# Patient Record
Sex: Female | Born: 1935
Health system: Southern US, Community
[De-identification: ages and names within clinical notes are randomized; demographics above are authoritative.]

## PROBLEM LIST (undated history)

## (undated) DIAGNOSIS — K589 Irritable bowel syndrome without diarrhea: Secondary | ICD-10-CM

## (undated) DIAGNOSIS — K5790 Diverticulosis of intestine, part unspecified, without perforation or abscess without bleeding: Secondary | ICD-10-CM

## (undated) DIAGNOSIS — K222 Esophageal obstruction: Secondary | ICD-10-CM

## (undated) DIAGNOSIS — K635 Polyp of colon: Secondary | ICD-10-CM

## (undated) DIAGNOSIS — K219 Gastro-esophageal reflux disease without esophagitis: Secondary | ICD-10-CM

## (undated) DIAGNOSIS — I341 Nonrheumatic mitral (valve) prolapse: Secondary | ICD-10-CM

## (undated) DIAGNOSIS — K449 Diaphragmatic hernia without obstruction or gangrene: Secondary | ICD-10-CM

## (undated) HISTORY — PX: FOOT SURGERY: SHX648

## (undated) HISTORY — DX: Diaphragmatic hernia without obstruction or gangrene: K44.9

## (undated) HISTORY — DX: Diverticulosis of intestine, part unspecified, without perforation or abscess without bleeding: K57.90

## (undated) HISTORY — PX: CATARACT EXTRACTION: SUR2

## (undated) HISTORY — DX: Polyp of colon: K63.5

## (undated) HISTORY — PX: ABDOMINAL HYSTERECTOMY: SHX81

## (undated) HISTORY — DX: Gastro-esophageal reflux disease without esophagitis: K21.9

## (undated) HISTORY — PX: EXCISION MORTON'S NEUROMA: SHX5013

## (undated) HISTORY — DX: Irritable bowel syndrome, unspecified: K58.9

## (undated) HISTORY — PX: SINUS SURGERY WITH INSTATRAK: SHX5215

## (undated) HISTORY — DX: Nonrheumatic mitral (valve) prolapse: I34.1

## (undated) HISTORY — DX: Esophageal obstruction: K22.2

## (undated) HISTORY — PX: CARDIAC CATHETERIZATION: SHX172

---

## 2002-12-13 HISTORY — PX: ESOPHAGOGASTRODUODENOSCOPY: SHX1529

## 2002-12-30 ENCOUNTER — Ambulatory Visit (HOSPITAL_COMMUNITY): Admission: RE | Admit: 2002-12-30 | Discharge: 2002-12-30 | Payer: Self-pay | Admitting: Internal Medicine

## 2004-01-27 ENCOUNTER — Ambulatory Visit (HOSPITAL_COMMUNITY): Admission: RE | Admit: 2004-01-27 | Discharge: 2004-01-27 | Payer: Self-pay | Admitting: Internal Medicine

## 2005-03-19 ENCOUNTER — Ambulatory Visit: Payer: Self-pay | Admitting: Internal Medicine

## 2005-03-29 ENCOUNTER — Encounter (HOSPITAL_COMMUNITY): Admission: RE | Admit: 2005-03-29 | Discharge: 2005-04-28 | Payer: Self-pay | Admitting: Internal Medicine

## 2005-06-18 ENCOUNTER — Ambulatory Visit: Payer: Self-pay | Admitting: Internal Medicine

## 2006-03-07 ENCOUNTER — Ambulatory Visit: Payer: Self-pay | Admitting: Internal Medicine

## 2006-07-29 ENCOUNTER — Ambulatory Visit: Payer: Self-pay | Admitting: Internal Medicine

## 2006-09-12 ENCOUNTER — Ambulatory Visit: Payer: Self-pay | Admitting: Internal Medicine

## 2006-09-12 ENCOUNTER — Encounter (INDEPENDENT_AMBULATORY_CARE_PROVIDER_SITE_OTHER): Payer: Self-pay | Admitting: Specialist

## 2006-09-12 ENCOUNTER — Ambulatory Visit (HOSPITAL_COMMUNITY): Admission: RE | Admit: 2006-09-12 | Discharge: 2006-09-12 | Payer: Self-pay | Admitting: Internal Medicine

## 2006-09-12 HISTORY — PX: COLONOSCOPY: SHX174

## 2006-11-13 ENCOUNTER — Ambulatory Visit: Payer: Self-pay | Admitting: Internal Medicine

## 2007-01-06 ENCOUNTER — Encounter: Admission: RE | Admit: 2007-01-06 | Discharge: 2007-01-06 | Payer: Self-pay | Admitting: *Deleted

## 2007-07-10 ENCOUNTER — Encounter: Admission: RE | Admit: 2007-07-10 | Discharge: 2007-07-10 | Payer: Self-pay | Admitting: *Deleted

## 2007-10-14 ENCOUNTER — Ambulatory Visit (HOSPITAL_COMMUNITY): Admission: RE | Admit: 2007-10-14 | Discharge: 2007-10-14 | Payer: Self-pay | Admitting: Pulmonary Disease

## 2007-11-24 ENCOUNTER — Ambulatory Visit: Payer: Self-pay | Admitting: Internal Medicine

## 2007-11-27 ENCOUNTER — Ambulatory Visit: Payer: Self-pay | Admitting: Internal Medicine

## 2008-06-09 ENCOUNTER — Encounter: Admission: RE | Admit: 2008-06-09 | Discharge: 2008-06-09 | Payer: Self-pay | Admitting: Internal Medicine

## 2008-12-08 ENCOUNTER — Ambulatory Visit: Payer: Self-pay | Admitting: Internal Medicine

## 2009-06-13 ENCOUNTER — Encounter: Admission: RE | Admit: 2009-06-13 | Discharge: 2009-06-13 | Payer: Self-pay | Admitting: Internal Medicine

## 2009-11-15 ENCOUNTER — Encounter (INDEPENDENT_AMBULATORY_CARE_PROVIDER_SITE_OTHER): Payer: Self-pay | Admitting: *Deleted

## 2010-06-14 ENCOUNTER — Encounter: Admission: RE | Admit: 2010-06-14 | Discharge: 2010-06-14 | Payer: Self-pay | Admitting: Internal Medicine

## 2010-12-03 ENCOUNTER — Encounter: Payer: Self-pay | Admitting: Internal Medicine

## 2010-12-12 NOTE — Letter (Signed)
Summary: Appointment Reminder  [facility]  [address]   [place], [place] [address]   Phone: [phone]  Fax: [phone]       November 15, 2009   [facility] [place] [address] [place], [place]  [address] 08-01-1931    Dear Ms. Gabrielson,  We have been unable to reach you by phone to schedule a follow up   appointment that was recommended for you by Dr. Jena Gauss. It is very   important that we reach you to schedule an appointment. We hope that you  allow [place] to participate in your health care needs. Please contact us at  [phone] at your earliest convenience to schedule your appointment.  Sincerely,    [facility] R. Roetta Sessions, M.D.    Kassie Mends, M.D. Lorenza Burton, FNP-BC    Tana Coast, PA-C Phone: [phone]    Fax: [phone]

## 2011-03-27 NOTE — Assessment & Plan Note (Signed)
Amy [name], Amy [name]                 CHART#:  [id number]   DATE:  12/08/2008                       DOB:  1931/08/13   FOLLOWUP:  IBS-D.   The patient has done very well, taking Librax sparingly a.c. and at  bedtime.  Several days may go by where she does not need to take  anything and then she has cramping and diarrhea.  Previously, stool  studies came back negative except for positive lactoferrin.  Biopsies of  her colon negative for microscopic colitis.  She is due for routine  screening colonoscopy in 2017.  Overall, doing well.  She is down 2  pounds, currently not having any GI symptoms whatsoever.  She requested  a refill on her clidinium/Librax.   CURRENT MEDICATIONS:  See updated list.   ALLERGIES:  Aspirin and sulfa.   PHYSICAL EXAMINATION:  GENERAL:  Today, looks well.  VITAL SIGNS:  Weight 180, height 5 feet 5-1/2 inches, temp 98.1, BP  130/82, and pulse 92.  SKIN:  Warm and dry.  CHEST:  Lungs are clear to auscultation.  CARDIAC:  Regular rate and rhythm without murmur, gallop, or rub.   ASSESSMENT:  Irritable bowel syndrome-D, doing well.  Not mentioned  above, she tells me, a good 25 out of any given 30 days period of time,  her symptoms were quiescent, which is really overall very good and I  feel her symptoms are relatively well controlled.   RECOMMENDATIONS:  Continue Librax a.c. and at bedtime p.r.n.  I have  given her a 1-year prescription refill.  Unless something comes up, I  plan to see her back in the office in 1 year.       Jonathon Bellows, M.D.  Electronically Signed     RMR/MEDQ  D:  12/08/2008  T:  12/08/2008  Job:  [id number]   cc:   Catalina Pizza, M.D.

## 2011-03-27 NOTE — Assessment & Plan Note (Signed)
Amy [name], Amy [name]                 CHART#:  [id number]   DATE:  11/24/2007                       DOB:  1931-03-07   CHIEF COMPLAINT:  Annual followup GERD and IBS- D.   SUBJECTIVE:  The patient is a 77 year old female with longstanding  history of IBS, which is diarrhea-predominant.  She takes a rare  clindium.  She has been doing very well over the last year.  She  occasionally has episodes where her bowels act up.  She is taking  Librax on occasion, maybe once or twice per month.  She has tried fiber  previously.  It did seem to cause some bloating, therefore she stopped  it.  She has been doing very well as far as her reflux is concerned.  Rarely when she eats a significant amount of chocolate or spicy foods,  she may have heartburn or indigestion.  She stopped omeprazole 20 mg  daily last year and has been taking an occasional Tums or Prilosec over-  the-counter on an as needed basis.  Her weight has remained stable.  She  denies any nausea, vomiting.  Denies any anorexia.  She does take an  aspirin 81 mg daily.  She has noticed some dark stools, but denies any  melena or rectal bleeding.   CURRENT MEDICATIONS:  See the list from 11/24/2007.   ALLERGIES:  Aspirin and sulfa.   OBJECTIVE:  VITAL SIGNS:  Weight:  182 pounds.  Height:  65-1/2 inches.  Temperature:  98 degrees.  Blood pressure:  120/78.  Pulse:  76.  GENERAL:  Amy [name] is a well-developed, well-nourished female in no  acute distress.  HEENT:  Sclerae clear. Conjunctivae pink. Oropharynx pink and moist  without any lesions.  CHEST:  Heart regular rate and rhythm.  Normal S1 and S2.  ABDOMEN:  Positive bowel sounds x4.  No bruits auscultated.  Soft,  nontender, nondistended without hepatosplenomegaly.  No rebound, tissue  guarding  EXTREMITIES:  Without clubbing or edema bilaterally.   ASSESSMENT:  1. The patient is a 77 year old female with IBS-D well controlled at      this time.  Chronic GERD with rare  symptoms:  I feel she may      benefit from intermittent H2 blocker rather than intermittent PPI      therapy.  2. Dark stools are concerning.  We should rule out bleeding/melena.   PLAN:  1. We will obtain recent labs from Dr. Scharlene Gloss office.  2. Colonoscopy 2017 or sooner if needed.  3. I have given her Benefiber samples and she can use fiber supplement      of choice.  4. Over-the-counter Zantac or Pepcid AC for intermittent GERD      symptoms.  5. Hemoccult stools x3.  6. Office visit in one year or sooner if needed.       Lorenza Burton, N.P.  Electronically Signed     R. Roetta Sessions, M.D.  Electronically Signed    KJ/MEDQ  D:  11/24/2007  T:  11/24/2007  Job:  [id number]   cc:   Catalina Pizza, M.D.

## 2011-03-30 NOTE — Op Note (Signed)
Amy [name], DARA                ACCOUNT NO.:  [id number]   MEDICAL RECORD NO.:  [id number]          PATIENT TYPE:  AMB   LOCATION:  DAY                           FACILITY:  [facility]   PHYSICIAN:  R. Roetta Sessions, M.D. DATE OF BIRTH:  [birth year]   DATE OF PROCEDURE:  09/12/2006  DATE OF DISCHARGE:                                 OPERATIVE REPORT   PROCEDURE:  Colonoscopy with biopsy, stool sampling, ileoscopy.   INDICATIONS FOR PROCEDURE:  The patient is a 77 year old lady with chronic  abdominal bloating with diarrhea which is intermittent in nature punctuated  with periods of constipation.  Colonoscopy is now being done for screening  and to further evaluate her diarrhea.  This approach has been discussed with  the patient at length.  Potential risks, benefits and alternatives have been  reviewed, questions answered.  She gives a history of wheat sensitivity.  We  did draw celiac panel, those results were not known to me at this time.   PROCEDURE NOTE:  O2 saturation, blood pressure, pulse and respirations were  monitor throughout the entire procedure.   CONSCIOUS SEDATION:  Versed 4 mg IV, Demerol 75 mg IV in divided doses.   INSTRUMENT:  Olympus video chip system.   FINDINGS:  Digital rectal exam revealed no abnormalities.   ENDOSCOPIC FINDINGS:  The prep was adequate.   Rectum:  Examination of rectal mucosa revealed a relatively small rectal  vault, I was unable to retroflex. Mucosa was well seen for this same reason  and appeared normal.  Colon:  Colonic mucosa was surveyed from rectosigmoid junction through the  left transverse and right colon, appendiceal orifice, ileocecal valve and  cecum.  These structures were well seen and photographed for the record.  Terminal ileum was intubated to 10 cm.  From this level the scope was slowly  withdrawn and all previously mentioned mucosal surfaces were again seen.  The patient had extensive sigmoid diverticula in the left colon  somewhat  noncompliant and there was a been of a challenge getting through it, but  this was ultimately done.  The mucosa was well seen.  She had numerous left-  sided diverticula.  However, the colon mucosa otherwise appeared normal.  Normal terminal ileum.  Biopsies of the sigmoid colon ruled out microscopic  colitis, also stool samples taken.  The patient tolerated the procedure well  as reactive to endoscopy.   IMPRESSION:  1. Normal rectum.  2. Extensive left-sided diverticula, rare colonic mucosa in terminal      ileum.  Mucosa appeared normal.  Segmental biopsy taken, stool sample      collected.   RECOMMENDATIONS:  Follow up on path and stool studies.  Further  recommendations to follow.      Jonathon Bellows, M.D.  Electronically Signed     RMR/MEDQ  D:  09/12/2006  T:  09/12/2006  Job:  [id number]   cc:   [facility]

## 2011-03-30 NOTE — H&P (Signed)
NAMEALEXANDRIA, Amy [name]                ACCOUNT NO.:  [id number]   MEDICAL RECORD NO.:  [id number]          PATIENT TYPE:  AMB   LOCATION:  DAY                           FACILITY:  [facility]   PHYSICIAN:  R. Roetta Sessions, M.D. DATE OF BIRTH:  06-Nov-1931   DATE OF ADMISSION:  DATE OF DISCHARGE:  LH                                HISTORY & PHYSICAL   CHIEF COMPLAINT:  Intermittent bloating, diarrhea and gastroesophageal  reflux disease.  Ms. Amy [name] __________  is a pleasant 77 year old Caucasian  female followed primarily by Dr. Wende Crease in [place], [place], who  has not been seen here in a good year.  She has a history of diarrhea  predominant irritable bowel syndrome with wide swings from diarrhea to  constipation who has had some abdominal bloating recently, reflux symptoms  intermittent two to three episodes a week for which she takes over-the-  counter Prilosec or generic omeprazole.  She is not having any dysphagia.  Has not has any melena or rectal bleeding although she did call him back in  April, thought her stools were a little bit darker.  CBC came back okay and  three Hemoccults came back negative.  She had a colonoscopy back in 2000  which revealed internal hemorrhoids, a long tortuous colon.  Prior  esophagogastroduodenoscopy demonstrated some gastric polyps.  Helicobacter  pylori serologies were negative.  She has had some intermittent right-sided  right upper quadrant abdominal pain for years.  Ultrasound and HIDA came  back normal.  She has not had a gyn evaluation in years.  There is no family  history of inflammatory bowel disease or colorectal neoplasia.  She tells me  that a doctor down in [place] did some allergy testing on her back in the  60s and she was told she had a wheat allergy.   She desires screening colonoscopy now but she is not due until 2010.   PAST MEDICAL HISTORY:  Mitral valve prolapse, irritable bowel syndrome,  asthma.   PAST SURGICAL  HISTORY:  Sinus surgery, cataract surgery, foot surgery,  Morton's neuroma surgery.   CURRENT MEDICATIONS:  Prilosec 20 mg as needed, Flovent inhaler as needed,  Librax 1 tablet twice daily as needed, Lopressor 100 mg twice daily.  __________ 1 tablet p.r.n., diazepam 5 mg tablet 1/2 tablet twice daily as  needed.   ALLERGIES:  ASPIRIN AND SULFA.   FAMILY HISTORY:  No chronic GI or liver illness.   SOCIAL HISTORY:  The patient is married 32 years.  She has two children. She  is retired.  No tobacco.  Rare alcohol consumption.   REVIEW OF SYSTEMS:  No chest pain, no dyspnea on exertion, no fever or  chills.   PHYSICAL EXAMINATION:  Pleasant 77 year old lady resting comfortably.  Weight 186, height 5 feet 5 inches, temperature 98.6, blood pressure 120/70,  pulse 78. SKIN:  Warm and dry.  There is no jaundice.  Sclerae __________  LUNGS:  Clear to auscultation.  CARDIOVASCULAR:  Regular rate and rhythm without murmurs, rubs or gallops.  BREAST:  Exam is deferred.  ABDOMEN:  Obese, positive bowel sounds.  She has minimal left lower quadrant  tenderness to palpation.  No appreciable mass  or organomegaly.  RECTAL:  Deferred until time of colonoscopy.   IMPRESSION:  Ms. __________ is a pleasant 77 year old lady with symptoms  consistent with irritable bowel syndrome.  Her old recollection of wheat  sensitivity is interesting and that probably should be pursued just a little  further.  She is a bit early for colorectal cancer screening but wants to  have her colon checked now and I do not feel this is unreasonable.  She is  overdue for a gynecological evaluation.  Her GERD symptoms are fairly well  controlled but she does take Prilosec in a suboptimal way and this may be  the explanation behind her tendency towards diarrhea.   RECOMMENDATIONS:  1. I will go ahead and give her some Aciphex samples, #30, 20 mg once each      morning  before breakfast.  2. Will go ahead and set her up  for screening colonoscopy.  Potential      risks, benefits and alternatives have been reviewed and questions      answered.  3. Will check a celiac panel for completion of her GI work-up.   Primary care physician, Dr. Doyne Keel, has now become a hospitalist.  I have  also recommended that she have a gyn evaluation as she is overdue if nothing  more than a wellness check.   Further recommendations to follow.      Jonathon Bellows, M.D.  Electronically Signed     RMR/MEDQ  D:  07/29/2006  T:  07/29/2006  Job:  [id number]

## 2011-03-30 NOTE — Op Note (Signed)
NAME:  Amy [name], Amy [name]                          ACCOUNT NO.:  [id number]   MEDICAL RECORD NO.:  [id number]                   PATIENT TYPE:  AMB   LOCATION:  DAY                                  FACILITY:  [facility]   PHYSICIAN:  R. Roetta Sessions, M.D.              DATE OF BIRTH:  1931/01/03   DATE OF PROCEDURE:  12/30/2002  DATE OF DISCHARGE:                                 OPERATIVE REPORT   PROCEDURE:  Esophagogastroduodenoscopy with Elease Hashimoto dilation, followed by  biopsy.   INDICATIONS FOR PROCEDURE:  The patient is a 77 year old lady with irritable  bowel syndrome and she has had long-standing reflux symptoms and recently  has developed esophageal dysphagia.  EGD is now being performed to further  evaluate her dysphagia.  The procedure was discussed with the patient  previously and the potential risks, benefits, and alternatives have been  reviewed and questions answered.   PROCEDURE NOTE:  O2 saturation, blood pressure, pulse, and respirations were  monitored throughout the entire procedure.   CONSCIOUS SEDATION:  Versed 2 mg, Demerol 50 mg IV.   The patient was also given ampicillin and gentamicin for SBE prophylaxis and  Cetacaine spray for topical oropharyngeal anesthesia.   INSTRUMENT:  Olympus video gastroscope.   FINDINGS:  On examination, the tubular esophagus revealed a prominent  Schatzki's ring, the remainder of the esophageal mucosa appeared normal.  The EG junction was easily traversed.  Entering into the stomach, gastric  cavity was emptied and insufflated well with air and further examination of  the gastric mucosa, including a retroflex view of the proximal stomach and  esophagogastric junction demonstrated a small hiatal hernia.  She had  multiple polyps, dozens, if not a couple of hundred of polyps, throughout  the gastric mucosa, ranging anywhere from size approximately 1-2 mm to 0.6-  0.7 cm, please see photos.  The remainder of the gastric mucosa appeared  normal, the pylorus was patent and easily traversed.  The duodenum, duodenal  bulb, and second and third portion appeared normal.   THERAPY DIAGNOSTIC MANEUVERS PERFORMED:  A 56 French Maloney dilator was  passed to full insertion with ease.  A look back revealed the ring had been  ruptured.  Subsequent biopsies of one of the larger polyps were taken for  histologic study.  The patient tolerated the procedure well and was reacted.   ENDOSCOPY IMPRESSION:  1. Prominent Schatzki's ring, status post dilation as described above,     remainder of esophageal mucosa appeared normal.  2. Hiatal hernia.  3. Multiple gastric polyps, biopsied, remainder of gastric mucosa and     duodenum to the second portion appeared normal.    RECOMMENDATIONS:  1. We will check Helicobacter pylori serologies, we will have her return     Hemoccult cards in approximately two weeks.  2. Continue Prilosec 20 mg orally daily.  3. Office visit to see how she is  doing in six weeks.                                               Jonathon Bellows, M.D.    RMR/MEDQ  D:  12/30/2002  T:  12/30/2002  Job:  [id number]   cc:   Barbera Setters. Doyne Keel, M.D.

## 2011-03-30 NOTE — H&P (Signed)
NAME:  Amy [name], Amy [name]                          ACCOUNT NO.:  [id number]   MEDICAL RECORD NO.:  [id number]                   PATIENT TYPE:  AMB   LOCATION:                                       FACILITY:  [facility]   PHYSICIAN:  R. Roetta Sessions, M.D.              DATE OF BIRTH:  Jan 25, 1931   DATE OF ADMISSION:  12/30/2002  DATE OF DISCHARGE:                                HISTORY & PHYSICAL   CHIEF COMPLAINT:  Long-standing reflux symptoms, recent bout of lower  abdominal pain.   HISTORY OF PRESENT ILLNESS:  The patient is a pleasant 77 year old lady  followed primarily by Dr. Ned Grace in [place].  She has long-standing  chronic diarrhea, predominant irritable bowel syndrome.  Last seen in our  practice December 09, 2001.  At that point in time she was really doing very  well.  She has intermittent constipation and diarrhea which have been stable  over time.  I performed a colonoscopy on this lady back on June 19, 1999.  She was found to have a normal rectum and left-sided diverticula, tortuous,  redundant, but otherwise normal appearing colon.  She is here.  Incidentally, she has had heartburn symptoms for 10-15 years, taking  Prilosec intermittently.  When she stops taking it, she has recurrent reflux  symptoms.  Has not had an EGD in the past 10-15 years.  Now, she also  complains of intermittent esophageal dysphagia to salads, has not had any  early satiety or nausea or vomiting.  She also had a one-week history of  what she describes as severe bilateral extreme lower quadrant pelvic pain,  had a pressure sensation when she was urinating, also had difficulty with  small volume nonbloody BMs.  She felt she might have had some fever and  chills during this time too.  Symptoms have since subsided.  She has not had  any imaging studies since her colonoscopy.  She has not had any blood per  rectum.   PAST MEDICAL HISTORY:  1. Mitral valve prolapse.  2. Irritable bowel syndrome.  3. Asthma.   PAST SURGICAL HISTORY:  1. Sinus surgery.  2. Cataract surgery.  3. Foot surgery.  4. Morton's neuroma.   CURRENT MEDICATIONS:  1. Inderal 20 mg daily.  2. Prilosec 20 mg daily.  3. Saline nasal spray.   ALLERGIES:  ASPIRIN, SULFA.   FAMILY HISTORY:  Negative for chronic GI or liver disease.   SOCIAL HISTORY:  Married for 29 years.  Has two children.  Retired.  No  tobacco.  Occasional alcohol.   REVIEW OF SYSTEMS:  As above.   PHYSICAL EXAMINATION:  GENERAL:  Pleasant 77 year old lady resting  comfortably.  VITAL SIGNS:  Weight 18, blood pressure 120/80, pulse 70.  SKIN:  Warm and dry.  HEENT:  No scleral icterus.  Conjunctivae are pink.  NECK:  JVD is not prominent.  CHEST:  Lungs are clear to auscultation.  CARDIAC:  Regular rate and rhythm without murmur, gallop, rub.  BREASTS:  Deferred.  ABDOMEN:  Nondistended.  Positive bowel sounds, soft, entirely nontender to  palpation.  No appreciable mass or organomegaly.   ASSESSMENT:  The patient is a pleasant 77 year old lady with irritable bowel  syndrome.  She had a recent bout of bilateral lower quadrant pelvic pain  that lasted a week or so.  Symptoms suspicious for diverticulitis.  Clinically, she is improved.  She has long-standing prominent reflux  symptoms well controlled on pro time pump inhibitor therapy and now has  esophageal dysphagia.   RECOMMENDATIONS:  1. I have offered the patient EGD to evaluate her long-standing reflux     symptoms, esophageal dysphagia.  Potential risks, benefits, alternatives     have been reviewed.  Questions answered.  She has mitral valve prolapse     and receives SBE prophylactic antibiotics.  Will provide those for her at     the time of EGD.  I have asked her to start a fiber supplement in the way     of Benefiber one tablespoon daily or Citrucel one dose daily.  2. Also given her a prescription for NuLev one tablet a.c. and h.s.  We     talked about side effects  including dry mouth, etc. p.r.n. abdominal     cramps and diarrhea.  3. Will consider having her return three Hemoccults after EGD has been     performed.  4. Further recommendations to follow.                                               Jonathon Bellows, M.D.    RMR/MEDQ  D:  12/24/2002  T:  12/24/2002  Job:  [id number]   cc:   Wende Crease, M.D.

## 2011-05-23 ENCOUNTER — Other Ambulatory Visit: Payer: Self-pay | Admitting: Internal Medicine

## 2011-05-23 DIAGNOSIS — Z1231 Encounter for screening mammogram for malignant neoplasm of breast: Secondary | ICD-10-CM

## 2011-06-18 ENCOUNTER — Ambulatory Visit
Admission: RE | Admit: 2011-06-18 | Discharge: 2011-06-18 | Disposition: A | Discharge status: Home / Self Care | Payer: Medicare HMO | Source: Ambulatory Visit | Attending: Internal Medicine | Admitting: Internal Medicine

## 2011-06-18 DIAGNOSIS — Z1231 Encounter for screening mammogram for malignant neoplasm of breast: Secondary | ICD-10-CM

## 2011-09-03 ENCOUNTER — Encounter: Payer: Self-pay | Admitting: Gastroenterology

## 2011-09-03 ENCOUNTER — Ambulatory Visit (INDEPENDENT_AMBULATORY_CARE_PROVIDER_SITE_OTHER): Payer: Medicare HMO | Admitting: Gastroenterology

## 2011-09-03 VITALS — BP 144/82 | HR 102 | Temp 97.3°F | Ht 65.0 in | Wt 169.2 lb

## 2011-09-03 DIAGNOSIS — R198 Other specified symptoms and signs involving the digestive system and abdomen: Secondary | ICD-10-CM

## 2011-09-03 DIAGNOSIS — R1310 Dysphagia, unspecified: Secondary | ICD-10-CM | POA: Insufficient documentation

## 2011-09-03 DIAGNOSIS — R1314 Dysphagia, pharyngoesophageal phase: Secondary | ICD-10-CM

## 2011-09-03 DIAGNOSIS — R1319 Other dysphagia: Secondary | ICD-10-CM

## 2011-09-03 DIAGNOSIS — K6289 Other specified diseases of anus and rectum: Secondary | ICD-10-CM

## 2011-09-03 DIAGNOSIS — K219 Gastro-esophageal reflux disease without esophagitis: Secondary | ICD-10-CM

## 2011-09-03 DIAGNOSIS — K589 Irritable bowel syndrome without diarrhea: Secondary | ICD-10-CM

## 2011-09-03 DIAGNOSIS — R194 Change in bowel habit: Secondary | ICD-10-CM

## 2011-09-03 MED ORDER — CILIDINIUM-CHLORDIAZEPOXIDE 2.5-5 MG PO CAPS: 1.0000 | ORAL_CAPSULE | Freq: Three times a day (TID) | ORAL | Status: DC | PRN

## 2011-09-03 NOTE — Progress Notes (Signed)
Primary Care Physician:  Dwana Melena, MD  Primary Gastroenterologist:  Roetta Sessions, MD   Chief Complaint  Patient presents with  . Abdominal Pain    loud bowel sounds    HPI:  Amy [name] is a 77 y.o. female here for further evaluation of recent abdominal pain associated with change in bowels. She has a history of chronic GERD irritable bowel syndrome. She was last seen in 2010. Rarely takes Librax. She states she was doing very well up until about 2 weeks ago. Two weeks ago started having horrible bowels sounds followed by intermittent abdominal pain more on the right side. Started having increased diarrhea. Stools are more frequent but not watery. Stool consistency more mushy. Feels like she cannot empty her rectum. This is associated with leakage and discomfort. Happens with each stool at this point. Continues to have lower abdominal pain over the past 2 weeks. No vomiting. Stools are thin. Most of times stools are loose. No blood in stool. Abdominal pain at this point is very mild. Some heartburn but not bad enough to take anything. No fever. No black stools. She has some difficulty swallowing again. This is been more pronounced over the past one year but still intermittent. Associated with solid foods only.   No librax at this time. No recent antibiotics.   Current Outpatient Prescriptions  Medication Sig Dispense Refill  . albuterol (PROVENTIL HFA;VENTOLIN HFA) 108 (90 BASE) MCG/ACT inhaler Inhale 2 puffs into the lungs every 6 (six) hours as needed.        . fluticasone (FLOVENT DISKUS) 50 MCG/BLIST diskus inhaler Inhale 1 puff into the lungs 2 (two) times daily.        . metoprolol (TOPROL-XL) 50 MG 24 hr tablet Take 50 mg by mouth daily.       . clidinium-chlordiazePOXIDE (LIBRAX) 2.5-5 MG per capsule Take 1 capsule by mouth 3 (three) times daily as needed (for abd cramps and diarrhea).  15 capsule  3    Allergies as of 09/03/2011 - Review Complete 09/03/2011  Allergen Reaction  Noted  . Aspirin Other (See Comments) 09/03/2011  . Sulfa antibiotics Itching 09/03/2011    Past Medical History  Diagnosis Date  . MVP (mitral valve prolapse)   . IBS (irritable bowel syndrome)   . Asthma   . GERD (gastroesophageal reflux disease)     Past Surgical History  Procedure Date  . Sinus surgery with instatrak   . Cataract extraction   . Foot surgery     right  . Excision morton's neuroma   . Colonoscopy 09/2006    normal TI, left sided diverticulosis, random bx negative for microscopic colitis  . Esophagogastroduodenoscopy 12/2002    Schatzki's ring s/p dilation, hh, gastric polyps (hyperplastic), H. Pylori serologies negative    Family History  Problem Relation Age of Onset  . Colon cancer Neg Hx   . Liver disease Neg Hx     History   Social History  . Marital Status: Married    Spouse Name: N/A    Number of Children: 2  . Years of Education: N/A   Occupational History  . retired    Social History Main Topics  . Smoking status: Former Smoker -- 0.2 packs/day    Types: Cigarettes  . Smokeless tobacco: Former Neurosurgeon    Quit date: 12/03/1974  . Alcohol Use: No  . Drug Use: No  . Sexually Active: Not on file   Other Topics Concern  . Not on file  Social History Narrative  . No narrative on file      ROS:  General: Negative for anorexia, weight loss, fever, chills, fatigue, weakness. Eyes: Negative for vision changes.  ENT: Negative for hoarseness,nasal congestion. CV: Negative for chest pain, angina, palpitations, dyspnea on exertion, peripheral edema.  Respiratory: Negative for dyspnea at rest, dyspnea on exertion, cough, sputum, wheezing.  GI: See history of present illness. GU:  Negative for dysuria, hematuria, urinary incontinence, urinary frequency, nocturnal urination.  MS: Negative for joint pain, low back pain.  Derm: Negative for rash or itching.  Neuro: Negative for weakness, abnormal sensation, seizure, frequent headaches,  memory loss, confusion.  Psych: Negative for anxiety, depression, suicidal ideation, hallucinations.  Endo: Negative for unusual weight change.  Heme: Negative for bruising or bleeding. Allergy: Negative for rash or hives.    Physical Examination:  BP 144/82  Pulse 102  Temp(Src) 97.3 F (36.3 C) (Temporal)  Ht 5\' 5"  (1.651 m)  Wt 169 lb 3.2 oz (76.749 kg)  BMI 28.16 kg/m2   General: Well-nourished, well-developed in no acute distress.  Head: Normocephalic, atraumatic.   Eyes: Conjunctiva pink, no icterus. Mouth: Oropharyngeal mucosa moist and pink , no lesions erythema or exudate. Neck: Supple without thyromegaly, masses, or lymphadenopathy.  Lungs: Clear to auscultation bilaterally.  Heart: Regular rate and rhythm, no murmurs rubs or gallops.  Abdomen: Bowel sounds are normal, mild epigastric/right upper quadrant tenderness, nondistended, no hepatosplenomegaly or masses, no abdominal bruits or    hernia , no rebound or guarding.   Rectal: Deferred to time of colonoscopy. Extremities: No lower extremity edema. No clubbing or deformities.  Neuro: Alert and oriented x 4 , grossly normal neurologically.  Skin: Warm and dry, no rash or jaundice.   Psych: Alert and cooperative, normal mood and affect.

## 2011-09-03 NOTE — Patient Instructions (Signed)
We have scheduled you for a colonoscopy and upper endoscopy with Dr. Rourk. Please see separate instructions. 

## 2011-09-03 NOTE — Assessment & Plan Note (Signed)
History bowel syndrome which has been quiescent over the last 2 years. She uses less than 15 Librax per year but has not used any in over one year. 2 weeks ago started having right-sided abdominal pain which is unusual for her. Associated with more frequent stools. Abdominal pain has improved and is more lower abdomen at this point. She feels like she cannot empty her rectum. She has a history of a small rectal vault. She complains of fecal soilage. This may be nothing more than IBS flare given that her colonoscopies over 5 years ago and she has new symptoms and concern, offered her colonoscopy.  I have discussed the risks, alternatives, benefits with regards to but not limited to the risk of reaction to medication, bleeding, infection, perforation and the patient is agreeable to proceed. Written consent to be obtained.

## 2011-09-03 NOTE — Assessment & Plan Note (Signed)
Progressive solid food esophageal dysphagia. History of Schatzki ring status post dilation in 2004. Offered her an EGD with dilation.  I have discussed the risks, alternatives, benefits with regards to but not limited to the risk of reaction to medication, bleeding, infection, perforation and the patient is agreeable to proceed. Written consent to be obtained.

## 2011-09-03 NOTE — Progress Notes (Signed)
Cc to PCP 

## 2011-09-17 ENCOUNTER — Telehealth: Payer: Self-pay | Admitting: General Practice

## 2011-09-17 NOTE — Telephone Encounter (Signed)
I called the p30er procedure from 11/19 to 10/08/11@7 :30am.  Pt understood and new instructions placed in the mail.

## 2011-10-16 ENCOUNTER — Telehealth: Payer: Self-pay | Admitting: Gastroenterology

## 2011-10-16 NOTE — Telephone Encounter (Signed)
Ok as is

## 2011-10-16 NOTE — Telephone Encounter (Signed)
Routing to Crystal.

## 2011-10-16 NOTE — Telephone Encounter (Signed)
Gastroenterology Pre-Procedure Form  Request Date: 10/31/11,  Requesting Physician:      PATIENT INFORMATION:  Amy [name] is a 77 y.o., female (DOB=Jul 16, 1931).  PROCEDURE: Procedure(s) requested: colonoscopy & EGD Procedure Reason: change in bowel habits & dysphagia   PATIENT REVIEW QUESTIONS: The patient reports the following:   1. Diabetes Melitis: no 2. Joint replacements in the past 12 months: no 3. Major health problems in the past 3 months: no 4. Has an artificial valve or MVP:yes MVP 5. Has been advised in past to take antibiotics in advance of a procedure like teeth cleaning: no}    MEDICATIONS & ALLERGIES:    Patient reports the following regarding taking any blood thinners:   Plavix? no Aspirin?no Coumadin?  no  Patient confirms/reports the following medications:  Current Outpatient Prescriptions  Medication Sig Dispense Refill  . albuterol (PROVENTIL HFA;VENTOLIN HFA) 108 (90 BASE) MCG/ACT inhaler Inhale 2 puffs into the lungs every 6 (six) hours as needed.        . clidinium-chlordiazePOXIDE (LIBRAX) 2.5-5 MG per capsule Take 1 capsule by mouth 3 (three) times daily as needed (for abd cramps and diarrhea).  15 capsule  3  . fluticasone (FLOVENT DISKUS) 50 MCG/BLIST diskus inhaler Inhale 1 puff into the lungs 2 (two) times daily.        . metoprolol (TOPROL-XL) 50 MG 24 hr tablet Take 50 mg by mouth daily.         Patient confirms/reports the following allergies:  Allergies  Allergen Reactions  . Aspirin Other (See Comments)    Fast heart rate   . Sulfa Antibiotics Itching    Patient is appropriate to schedule for requested procedure(s): yes   No orders of the defined types were placed in this encounter.    SCHEDULE INFORMATION: Procedure has been scheduled as follows:  Date: 10/31/11, Time:   Location: [facility]   This Gastroenterology Pre-Precedure Form is being routed to the following provider(s) for review: R. Roetta Sessions, MD

## 2011-10-22 ENCOUNTER — Encounter (HOSPITAL_COMMUNITY): Payer: Self-pay | Admitting: Pharmacy Technician

## 2011-10-31 ENCOUNTER — Ambulatory Visit (HOSPITAL_COMMUNITY): Admission: RE | Admit: 2011-10-31 | Payer: Medicare HMO | Source: Ambulatory Visit | Admitting: Internal Medicine

## 2011-10-31 ENCOUNTER — Encounter (HOSPITAL_COMMUNITY): Admission: RE | Payer: Self-pay | Source: Ambulatory Visit

## 2011-10-31 SURGERY — COLONOSCOPY, ESOPHAGOGASTRODUODENOSCOPY (EGD) AND ESOPHAGEAL DILATION (ED)
Anesthesia: Moderate Sedation

## 2011-11-19 ENCOUNTER — Ambulatory Visit: Payer: Medicare HMO | Admitting: Gastroenterology

## 2011-11-29 ENCOUNTER — Encounter: Payer: Self-pay | Admitting: Gastroenterology

## 2011-11-29 ENCOUNTER — Ambulatory Visit (INDEPENDENT_AMBULATORY_CARE_PROVIDER_SITE_OTHER): Payer: Medicare HMO | Admitting: Gastroenterology

## 2011-11-29 VITALS — BP 123/72 | HR 92 | Temp 97.6°F | Ht 65.0 in | Wt 164.8 lb

## 2011-11-29 DIAGNOSIS — R197 Diarrhea, unspecified: Secondary | ICD-10-CM

## 2011-11-29 DIAGNOSIS — R1310 Dysphagia, unspecified: Secondary | ICD-10-CM | POA: Insufficient documentation

## 2011-11-29 MED ORDER — PEG-KCL-NACL-NASULF-NA ASC-C 100 G PO SOLR: 1.0000 | Freq: Once | ORAL | Status: AC

## 2011-11-29 NOTE — Progress Notes (Signed)
Faxed to PCP

## 2011-11-29 NOTE — Progress Notes (Signed)
Referring Provider: Dwana Melena, MD Primary Care Physician:  Amy Melena, MD, MD Primary Gastroenterologist: Dr. Jena Gauss   Chief Complaint  Patient presents with  . Colonoscopy    HPI:   Amy [name] is a 78 year old female who was originally set up for a colonoscopy and EGD in late October/November but had to reschedule/cancel several times. She presents today for an updated office visit prior to procedures. She does have a history of chronic GERD and IBS. She has noticed a change in her bowel habits to include more loose stools than normal as well as intermittent fecal incontinence. States loose stools usually occur with fatty foods and fast foods, burgers from fast food joints. Denies [name] or hematochezia. Notes intermittent abdominal discomfort described as gas.  Also reports intermittent dysphagia and sensation of food lodging in epigastric region. She breathes deep, sits quietly until it passes. No loss of appetite. No N/V. Does have history of known Schatzki's ring, s/p dilation in 2004.   Past Medical History  Diagnosis Date  . MVP (mitral valve prolapse)   . IBS (irritable bowel syndrome)   . Asthma   . GERD (gastroesophageal reflux disease)     Past Surgical History  Procedure Date  . Sinus surgery with instatrak   . Cataract extraction   . Foot surgery     right  . Excision morton's neuroma   . Colonoscopy 09/2006    normal TI, left sided diverticulosis, random bx negative for microscopic colitis  . Esophagogastroduodenoscopy 12/2002    Schatzki's ring s/p dilation, hh, gastric polyps (hyperplastic), H. Pylori serologies negative    Current Outpatient Prescriptions  Medication Sig Dispense Refill  . albuterol (PROVENTIL HFA;VENTOLIN HFA) 108 (90 BASE) MCG/ACT inhaler Inhale 2 puffs into the lungs every 6 (six) hours as needed. For shortness of breath      . clidinium-chlordiazePOXIDE (LIBRAX) 2.5-5 MG per capsule Take 1 capsule by mouth 3 (three) times daily as needed.  For abdominal cramps/diarrhea       . fluticasone (FLOVENT DISKUS) 50 MCG/BLIST diskus inhaler Inhale 1 puff into the lungs 2 (two) times daily.        Marland Kitchen ibuprofen (ADVIL,MOTRIN) 200 MG tablet Take 200 mg by mouth every 6 (six) hours as needed. For pain       . metoprolol (TOPROL-XL) 50 MG 24 hr tablet Take 50 mg by mouth daily.         Allergies as of 11/29/2011 - Review Complete 11/29/2011  Allergen Reaction Noted  . Aspirin Other (See Comments) 09/03/2011  . Latex Itching 10/22/2011  . Sulfa antibiotics Itching 09/03/2011    Family History  Problem Relation Age of Onset  . Colon cancer Neg Hx   . Liver disease Neg Hx     History   Social History  . Marital Status: Married    Spouse Name: N/A    Number of Children: 2  . Years of Education: N/A   Occupational History  . retired    Social History Main Topics  . Smoking status: Former Smoker -- 0.2 packs/day    Types: Cigarettes  . Smokeless tobacco: Former Neurosurgeon    Quit date: 12/03/1974  . Alcohol Use: No  . Drug Use: No  . Sexually Active: None   Other Topics Concern  . None   Social History Narrative  . None    Review of Systems: Gen: Denies fever, chills, anorexia. Denies fatigue, weakness, weight loss.  CV: Denies chest pain, palpitations, syncope, peripheral edema, and claudication.  Resp: Denies dyspnea at rest, cough, wheezing, coughing up blood, and pleurisy. GI: SEE HPI Derm: Denies rash, itching, dry skin Psych: Denies depression, anxiety, memory loss, confusion. No homicidal or suicidal ideation.  Heme: Denies bruising, bleeding, and enlarged lymph nodes.  Physical Exam: BP 123/72  Pulse 92  Temp(Src) 97.6 F (36.4 C) (Temporal)  Ht 5\' 5"  (1.651 m)  Wt 164 lb 12.8 oz (74.753 kg)  BMI 27.42 kg/m2 General:   Alert and oriented. No distress noted. Pleasant and cooperative.  Head:  Normocephalic and atraumatic. Eyes:  Conjuctiva clear without scleral icterus. Mouth:  Oral mucosa pink and moist.  Good dentition. No lesions. Neck:  Supple, without mass or thyromegaly. Heart:  S1, S2 present without murmurs, rubs, or gallops. Regular rate and rhythm. Abdomen:  +BS, soft, non-tender and non-distended. No rebound or guarding. No HSM or masses noted. Msk:  Symmetrical without gross deformities. Normal posture. Extremities:  Without edema. Neurologic:  Alert and  oriented x4;  grossly normal neurologically. Skin:  Intact without significant lesions or rashes. Cervical Nodes:  No significant cervical adenopathy. Psych:  Alert and cooperative. Normal mood and affect.

## 2011-11-29 NOTE — Patient Instructions (Signed)
We have set you up for a colonoscopy and an upper endoscopy with Dr. Jena Gauss.   I have also given you samples of a probiotic. You may take this once daily. You may find this over-the-counter. Just a few names are Align, Restora, Digestive Advantage. You can speak to the pharmacist if you need help finding the best one for the price.  Avoid fatty, greasy foods for now. We will provide further recommendations once this is completed.

## 2011-11-29 NOTE — Assessment & Plan Note (Signed)
Esophageal dysphagia in the setting of a known Schatzki's ring, last dilation in 2004. No other concerning factors such as abdominal pain, wt loss, lack of appetite, N/V. Will proceed with EGD/ED at time of TCS.  Proceed with upper endoscopy/dilation in the near future with Dr. Jena Gauss. The risks, benefits, and alternatives have been discussed in detail with patient. They have stated understanding and desire to proceed.

## 2011-11-29 NOTE — Assessment & Plan Note (Signed)
78 year old female with history of IBS but now with bowel habits changes to include fecal incontinence, increase in loose stools. Notes loose stools specifically after eating fast foods, fatty foods. No hematochezia. Likely r/t her hx of IBS, but she does note new symptom of incontinence. She desires to proceed with a colonoscopy. As it has been since 2007, we will proceed with this route.  Proceed with TCS with Dr. Jena Gauss in near future: the risks, benefits, and alternatives have been discussed with the patient in detail. The patient states understanding and desires to proceed. Probiotic daily, samples provided

## 2011-12-14 ENCOUNTER — Encounter (HOSPITAL_COMMUNITY): Payer: Self-pay | Admitting: Pharmacy Technician

## 2011-12-18 MED ORDER — SODIUM CHLORIDE 0.45 % IV SOLN
Freq: Once | INTRAVENOUS | Status: AC
  Administered 2011-12-19: 1000 mL via INTRAVENOUS

## 2011-12-19 ENCOUNTER — Encounter (HOSPITAL_COMMUNITY)
Admission: RE | Disposition: A | Discharge status: Home / Self Care | Payer: Self-pay | Source: Ambulatory Visit | Attending: Internal Medicine

## 2011-12-19 ENCOUNTER — Encounter (HOSPITAL_COMMUNITY): Payer: Self-pay | Admitting: *Deleted

## 2011-12-19 ENCOUNTER — Ambulatory Visit (HOSPITAL_COMMUNITY)
Admission: RE | Admit: 2011-12-19 | Discharge: 2011-12-19 | Disposition: A | Discharge status: Home / Self Care | Payer: Medicare HMO | Source: Ambulatory Visit | Attending: Internal Medicine | Admitting: Internal Medicine

## 2011-12-19 ENCOUNTER — Other Ambulatory Visit: Payer: Self-pay | Admitting: Internal Medicine

## 2011-12-19 DIAGNOSIS — K5730 Diverticulosis of large intestine without perforation or abscess without bleeding: Secondary | ICD-10-CM

## 2011-12-19 DIAGNOSIS — R197 Diarrhea, unspecified: Secondary | ICD-10-CM

## 2011-12-19 DIAGNOSIS — R1310 Dysphagia, unspecified: Secondary | ICD-10-CM

## 2011-12-19 DIAGNOSIS — K2100 Gastro-esophageal reflux disease with esophagitis, without bleeding: Secondary | ICD-10-CM | POA: Insufficient documentation

## 2011-12-19 DIAGNOSIS — D131 Benign neoplasm of stomach: Secondary | ICD-10-CM | POA: Insufficient documentation

## 2011-12-19 DIAGNOSIS — K449 Diaphragmatic hernia without obstruction or gangrene: Secondary | ICD-10-CM | POA: Insufficient documentation

## 2011-12-19 DIAGNOSIS — K222 Esophageal obstruction: Secondary | ICD-10-CM | POA: Insufficient documentation

## 2011-12-19 HISTORY — PX: COLONOSCOPY: SHX5424

## 2011-12-19 HISTORY — PX: ESOPHAGOGASTRODUODENOSCOPY: SHX1529

## 2011-12-19 LAB — CLOSTRIDIUM DIFFICILE BY PCR: Toxigenic C. Difficile by PCR: NEGATIVE

## 2011-12-19 SURGERY — COLONOSCOPY
Anesthesia: Moderate Sedation

## 2011-12-19 MED ORDER — BUTAMBEN-TETRACAINE-BENZOCAINE 2-2-14 % EX AERO
INHALATION_SPRAY | CUTANEOUS | Status: DC | PRN
  Administered 2011-12-19: 1 via TOPICAL
  Administered 2011-12-19: 2 via TOPICAL

## 2011-12-19 MED ORDER — MIDAZOLAM HCL 5 MG/5ML IJ SOLN
INTRAMUSCULAR | Status: DC | PRN
  Administered 2011-12-19 (×2): 1 mg via INTRAVENOUS
  Administered 2011-12-19: 2 mg via INTRAVENOUS
  Administered 2011-12-19: 1 mg via INTRAVENOUS

## 2011-12-19 MED ORDER — MIDAZOLAM HCL 5 MG/5ML IJ SOLN
INTRAMUSCULAR | Status: AC
  Filled 2011-12-19: qty 10

## 2011-12-19 MED ORDER — PANTOPRAZOLE SODIUM 40 MG PO TBEC: 40.0000 mg | DELAYED_RELEASE_TABLET | Freq: Every day | ORAL | Status: AC

## 2011-12-19 MED ORDER — MEPERIDINE HCL 100 MG/ML IJ SOLN
INTRAMUSCULAR | Status: DC | PRN
  Administered 2011-12-19: 25 mg via INTRAVENOUS
  Administered 2011-12-19: 50 mg via INTRAVENOUS

## 2011-12-19 MED ORDER — STERILE WATER FOR IRRIGATION IR SOLN
Status: DC | PRN
  Administered 2011-12-19: 08:00:00

## 2011-12-19 MED ORDER — PANTOPRAZOLE SODIUM 40 MG PO TBEC
40.0000 mg | DELAYED_RELEASE_TABLET | Freq: Every day | ORAL | Status: DC
  Filled 2011-12-19 (×2): qty 1

## 2011-12-19 MED ORDER — MEPERIDINE HCL 100 MG/ML IJ SOLN
INTRAMUSCULAR | Status: AC
  Filled 2011-12-19: qty 2

## 2011-12-19 NOTE — H&P (Signed)
  I have seen & examined the patient prior to the procedure(s) today and reviewed the history and physical/consultation.  There have been no changes.  After consideration of the risks, benefits, alternatives and imponderables, the patient has consented to the procedure(s).   

## 2011-12-19 NOTE — Op Note (Signed)
[facility] [address] [place], [place]  [address]  ENDOSCOPY PROCEDURE REPORT  PATIENT:  [name], Amy  MR#:  [id number] BIRTHDATE:  1931/08/01, 80 yrs. old  GENDER:  female  ENDOSCOPIST:  R. Roetta Sessions, MD Caleen Essex Referred by:  Catalina Pizza, M.D.  PROCEDURE DATE:  12/19/2011 PROCEDURE:  EGD with Elease Hashimoto dilation, gastric biopsy  INDICATIONS:      esophageal dysphagia  INFORMED CONSENT:   The risks, benefits, limitations, alternatives and imponderables have been discussed.  The potential for biopsy, esophogeal dilation, etc. have also been reviewed.  Questions have been answered.  All parties agreeable.  Please see the history and physical in the medical record for more information.  MEDICATIONS:   Versed 4 mg IV and Demerol 75 mg IV in divided doses. Cetacaine spray.  DESCRIPTION OF PROCEDURE:   The EG-[id number] ([id number]) endoscope was introduced through the mouth and advanced to the second portion of the duodenum without difficulty or limitations.  The mucosal surfaces were surveyed very carefully during advancement of the scope and upon withdrawal.  Retroflexion view of the proximal stomach and esophagogastric junction was performed.  <<PROCEDUREIMAGES>>  FINDINGS:  1-2 mm distal esophageal erosions superimposed on a Schatzki's Ring which appear to be noncritical. Otherwise, the esophagus appeared normal. Small hiatal hernia. Multiple fundal gland type polyps largest approximately  6 mm in dimensions; otherwise normal stomach. Duodenal diverticulum well into the second portion of the duodenum; otherwise D1 and D2 appear normal.   THERAPEUTIC / DIAGNOSTIC MANEUVERS PERFORMED:  56 Maloney dilator passed fully easily. A look back revealed no apparent complications. The ring was disrupted nicely. Subsequently, Biopsies of the fundal gland polyp were taken.  COMPLICATIONS:   None  IMPRESSION:     Reflux esophagitis-mild.Schatzki's ring-status post dilation  as described above. Small hiatal hernia. Fundal gland polyps- biopsied  RECOMMENDATIONS:  See colonoscopy report  ______________________________ R. Roetta Sessions, MD Caleen Essex  CC:  n. eSIGNED:   R. Roetta Sessions at 12/19/2011 08:07 AM  Carloyn Jaeger,   [phone]

## 2011-12-19 NOTE — Op Note (Signed)
[facility] [address] [place], [place]  [address]  COLONOSCOPY PROCEDURE REPORT  PATIENT:  Amy, [name]  MR#:  [id number] BIRTHDATE:  [birth year], 80 yrs. old  GENDER:  female ENDOSCOPIST:  R. Roetta Sessions, MD FACP [facility] REF. BY:  Catalina Pizza, M.D. PROCEDURE DATE:  12/19/2011 PROCEDURE:  ileo-colonoscopy with biopsy and stool sampling  INDICATIONS:  worsening of chronic intermittent, diarrhea  INFORMED CONSENT:  The risks, benefits, alternatives and imponderables including but not limited to bleeding, perforation as well as the possibility of a missed lesion have been reviewed. The potential for biopsy, lesion removal, etc. have also been discussed.  Questions have been answered.  All parties agreeable. Please see the history and physical in the medical record for more information.  MEDICATIONS:  Versed 5 mg IV and Demerol 75 mg IV in divided doses.  DESCRIPTION OF PROCEDURE:  After a digital rectal exam was performed, the EG-[id number] ([id number]) and EC-[id number] ([id number]) colonoscope was advanced from the anus through the rectum and colon to the area of the cecum, ileocecal valve and appendiceal orifice.  The cecum was deeply intubated.  These structures were well-seen and photographed for the record.  From the level of the cecum and ileocecal valve, the scope was slowly and cautiously withdrawn.  The mucosal surfaces were carefully surveyed utilizing scope tip deflection to facilitate fold flattening as needed.  The scope was pulled down into the rectum where a thorough examination including retroflexion was performed. <<PROCEDUREIMAGES>>  FINDINGS:  adequate preparation. Normal rectum. Scattered sigmoid and descending diverticula; the remainder of the colonic mucosa and                     distal 5 cm of terminal ileal mucosa appeared normal.  THERAPEUTIC / DIAGNOSTIC MANEUVERS PERFORMED:   biopsies of the sigmoid and ascending segments were taken to evaluate  for microscopic colitis.  stool sample obtained.  COMPLICATIONS:  none  CECAL WITHDRAWAL TIME:   8 minutes  IMPRESSION:      Colonic diverticulosis-status post biopsy and stool sampling.  RECOMMENDATIONS:     Follow up on pathology and Clostridium difficile toxin assay  ______________________________ R. Roetta Sessions, MD Caleen Essex  CC:  Catalina Pizza, M.D.  n. eSIGNED:   R. Roetta Sessions at 12/19/2011 08:25 AM  Carloyn Jaeger, [phone]

## 2011-12-23 ENCOUNTER — Encounter: Payer: Self-pay | Admitting: Internal Medicine

## 2011-12-25 ENCOUNTER — Encounter (HOSPITAL_COMMUNITY): Payer: Self-pay | Admitting: Internal Medicine

## 2012-01-18 ENCOUNTER — Ambulatory Visit (INDEPENDENT_AMBULATORY_CARE_PROVIDER_SITE_OTHER): Payer: Medicare HMO | Admitting: Internal Medicine

## 2012-01-18 ENCOUNTER — Encounter: Payer: Self-pay | Admitting: Internal Medicine

## 2012-01-18 DIAGNOSIS — K589 Irritable bowel syndrome without diarrhea: Secondary | ICD-10-CM

## 2012-01-18 DIAGNOSIS — R1310 Dysphagia, unspecified: Secondary | ICD-10-CM

## 2012-01-18 NOTE — Progress Notes (Signed)
Primary Care Physician:  Dwana Melena, MD, MD Primary Gastroenterologist:  Dr.   Pre-Procedure History & Physical: HPI:  Amy [name] is a 78 y.o. female here for followup. Doing well dysphagia resolved since her Schatzki's ring was recently dilated. She had a benign hyperplastic polyp in her stomach; colon biopsies demonstrated no evidence of microscopic colitis. C. difficile toxin assay negative. Doing very well; Protonix controls her reflux symptoms. Having normal bowel function without any assistance at this time. Has not started using a probiotic as of yet. Denies constipation or diarrhea.  Past Medical History  Diagnosis Date  . MVP (mitral valve prolapse)   . IBS (irritable bowel syndrome)   . Asthma   . GERD (gastroesophageal reflux disease)   . Schatzki's ring   . Hiatal hernia   . Diverticulosis   . Hyperplastic colon polyp     Past Surgical History  Procedure Date  . Sinus surgery with instatrak   . Cataract extraction   . Foot surgery     right  . Excision morton's neuroma   . Colonoscopy 09/2006    normal TI, left sided diverticulosis, random bx negative for microscopic colitis  . Esophagogastroduodenoscopy 12/2002    Schatzki's ring s/p dilation, hh, gastric polyps (hyperplastic), H. Pylori serologies negative  . Abdominal hysterectomy   . Cardiac catheterization   . Colonoscopy 12/19/2011    Procedure: COLONOSCOPY;  Surgeon: Corbin Ade, MD;  Location: [facility];  Service: Endoscopy;  Laterality: N/A;  7:30  . Esophagogastroduodenoscopy 12/19/2011    reflux,schatzki's ring, small hiatal hernia, fundal gland polyps    Prior to Admission medications   Medication Sig Start Date End Date Taking? Authorizing Provider  albuterol (PROVENTIL HFA;VENTOLIN HFA) 108 (90 BASE) MCG/ACT inhaler Inhale 2 puffs into the lungs every 6 (six) hours as needed. For shortness of breath   Yes Historical Provider, MD  clidinium-chlordiazePOXIDE (LIBRAX) 2.5-5 MG per capsule Take 1  capsule by mouth 3 (three) times daily as needed. For abdominal cramps/diarrhea  09/03/11 09/02/12 Yes Tana Coast, PA  fluticasone (FLOVENT DISKUS) 50 MCG/BLIST diskus inhaler Inhale 2 puffs into the lungs 2 (two) times daily.    Yes Historical Provider, MD  ibuprofen (ADVIL,MOTRIN) 200 MG tablet Take 200 mg by mouth every 6 (six) hours as needed. For pain    Yes Historical Provider, MD  metoprolol (TOPROL-XL) 50 MG 24 hr tablet Take 50 mg by mouth daily.  08/09/11  Yes Historical Provider, MD  pantoprazole (PROTONIX) 40 MG tablet Take 1 tablet (40 mg total) by mouth daily. 12/19/11 12/18/12 Yes Corbin Ade, MD  peg 3350 powder (MOVIPREP) 100 G SOLR Take 1 kit (100 g total) by mouth once. As directed Please purchase 1 Fleets enema to use with the prep 11/29/11  Yes Corbin Ade, MD    Allergies as of 01/18/2012 - Review Complete 01/18/2012  Allergen Reaction Noted  . Aspirin Other (See Comments) 09/03/2011  . Latex Itching 10/22/2011  . Sulfa antibiotics Itching 09/03/2011    Family History  Problem Relation Age of Onset  . Colon cancer Neg Hx   . Liver disease Neg Hx     History   Social History  . Marital Status: Married    Spouse Name: N/A    Number of Children: 2  . Years of Education: N/A   Occupational History  . retired    Social History Main Topics  . Smoking status: Former Smoker -- 0.2 packs/day    Types: Cigarettes  .  Smokeless tobacco: Former Neurosurgeon    Quit date: 12/03/1974  . Alcohol Use: No  . Drug Use: No  . Sexually Active: Not on file   Other Topics Concern  . Not on file   Social History Narrative  . No narrative on file    Review of Systems: See HPI, otherwise negative ROS  Physical Exam: BP 123/71  Pulse 85  Temp(Src) 98.4 F (36.9 C) (Temporal)  Ht 5\' 5"  (1.651 m)  Wt 167 lb 6.4 oz (75.932 kg)  BMI 27.86 kg/m2 General:   Alert,  Well-developed, well-nourished, pleasant and cooperative in NAD Skin:  Intact without significant lesions or  rashes. Eyes:  Sclera clear, no icterus.   Conjunctiva pink. Ears:  Normal auditory acuity. Nose:  No deformity, discharge,  or lesions. Mouth:  No deformity or lesions. Neck:  Supple; no masses or thyromegaly. No significant cervical adenopathy. Lungs:  Clear throughout to auscultation.   No wheezes, crackles, or rhonchi. No acute distress. Heart:  Regular rate and rhythm; no murmurs, clicks, rubs,  or gallops. Abdomen: Non-distended, normal bowel sounds.  Soft and nontender without appreciable mass or hepatosplenomegaly.  Pulses:  Normal pulses noted. Extremities:  Without clubbing or edema.  Impression/Plan:

## 2012-01-18 NOTE — Assessment & Plan Note (Signed)
Patient is very happy this point in time. Her reflux symptoms are well controlled. Dysphagia has resolved. And at least, for now,  IBS symptoms are quiscient.  I reviewed the benign the chronic nature of irritable bowel syndrome with the patient.  Recommendations: Consider probiotic therapy as maintenance treatment for IBS.  Continue Protonix 40 mg orally daily; I feel the benefits outweigh the risks of this approach.  Unless something comes up, we'll plan to see this nice lady back in the office in one year.

## 2012-01-18 NOTE — Patient Instructions (Signed)
Continue Protonix daily  Consider probiotic therapy as discussed  Return visit here in 1 year and as needed

## 2013-01-12 ENCOUNTER — Telehealth: Payer: Self-pay | Admitting: *Deleted

## 2013-01-12 NOTE — Telephone Encounter (Signed)
Ms Eagleton called this am. She had a black tarry stool this am. She was on amoxicillin, but stopped taking it last Wednesday because it was upsetting her stomach. Please call her back. Thank you.

## 2013-01-12 NOTE — Telephone Encounter (Signed)
If abnormal bowel movements persist, let us know.

## 2013-01-12 NOTE — Telephone Encounter (Signed)
Spoke with pt- she stated this morning she had a BM and noticed that it was greenish-black in color. No fever, recently took abx for a couple of days for a sinus infection but not taking now. No recent pepto. She stated she feels fine, no pain, no N/V. She stated she just wanted to let us know and if it happens again or she gets worse she will call us for an appt. She doesn't want an appt right now.

## 2013-01-28 ENCOUNTER — Encounter: Payer: Self-pay | Admitting: Internal Medicine

## 2013-02-11 ENCOUNTER — Other Ambulatory Visit (HOSPITAL_COMMUNITY): Payer: Self-pay | Admitting: Internal Medicine

## 2013-02-11 DIAGNOSIS — M899 Disorder of bone, unspecified: Secondary | ICD-10-CM

## 2013-02-13 ENCOUNTER — Other Ambulatory Visit (HOSPITAL_COMMUNITY): Payer: Medicare HMO

## 2013-03-09 ENCOUNTER — Ambulatory Visit (HOSPITAL_COMMUNITY)
Admission: RE | Admit: 2013-03-09 | Discharge: 2013-03-09 | Disposition: A | Discharge status: Home / Self Care | Payer: Medicare HMO | Source: Ambulatory Visit | Attending: Internal Medicine | Admitting: Internal Medicine

## 2013-03-09 DIAGNOSIS — M899 Disorder of bone, unspecified: Secondary | ICD-10-CM | POA: Insufficient documentation

## 2013-03-09 DIAGNOSIS — Z13820 Encounter for screening for osteoporosis: Secondary | ICD-10-CM | POA: Insufficient documentation

## 2013-03-09 DIAGNOSIS — M949 Disorder of cartilage, unspecified: Secondary | ICD-10-CM | POA: Insufficient documentation

## 2013-03-09 DIAGNOSIS — Z780 Asymptomatic menopausal state: Secondary | ICD-10-CM | POA: Insufficient documentation

## 2015-03-25 DIAGNOSIS — I10 Essential (primary) hypertension: Secondary | ICD-10-CM | POA: Diagnosis not present

## 2015-03-25 DIAGNOSIS — E782 Mixed hyperlipidemia: Secondary | ICD-10-CM | POA: Diagnosis not present

## 2015-03-29 DIAGNOSIS — E782 Mixed hyperlipidemia: Secondary | ICD-10-CM | POA: Diagnosis not present

## 2015-03-29 DIAGNOSIS — I10 Essential (primary) hypertension: Secondary | ICD-10-CM | POA: Diagnosis not present

## 2015-03-29 DIAGNOSIS — M545 Low back pain: Secondary | ICD-10-CM | POA: Diagnosis not present

## 2015-03-29 DIAGNOSIS — J4532 Mild persistent asthma with status asthmaticus: Secondary | ICD-10-CM | POA: Diagnosis not present

## 2015-04-21 ENCOUNTER — Ambulatory Visit (HOSPITAL_COMMUNITY): Payer: Medicare HMO | Admitting: Physical Therapy

## 2015-05-10 ENCOUNTER — Ambulatory Visit (HOSPITAL_COMMUNITY): Payer: Medicare HMO | Attending: Internal Medicine | Admitting: Physical Therapy

## 2015-05-11 ENCOUNTER — Telehealth (HOSPITAL_COMMUNITY): Payer: Self-pay

## 2015-06-16 DIAGNOSIS — N390 Urinary tract infection, site not specified: Secondary | ICD-10-CM | POA: Diagnosis not present

## 2015-07-15 DIAGNOSIS — I10 Essential (primary) hypertension: Secondary | ICD-10-CM | POA: Diagnosis not present

## 2015-07-15 DIAGNOSIS — E782 Mixed hyperlipidemia: Secondary | ICD-10-CM | POA: Diagnosis not present

## 2015-07-20 DIAGNOSIS — I10 Essential (primary) hypertension: Secondary | ICD-10-CM | POA: Diagnosis not present

## 2015-07-20 DIAGNOSIS — E782 Mixed hyperlipidemia: Secondary | ICD-10-CM | POA: Diagnosis not present

## 2015-11-08 DIAGNOSIS — Z23 Encounter for immunization: Secondary | ICD-10-CM | POA: Diagnosis not present

## 2016-01-19 DIAGNOSIS — E559 Vitamin D deficiency, unspecified: Secondary | ICD-10-CM | POA: Diagnosis not present

## 2016-01-19 DIAGNOSIS — E782 Mixed hyperlipidemia: Secondary | ICD-10-CM | POA: Diagnosis not present

## 2016-01-19 DIAGNOSIS — I10 Essential (primary) hypertension: Secondary | ICD-10-CM | POA: Diagnosis not present

## 2016-02-01 DIAGNOSIS — E559 Vitamin D deficiency, unspecified: Secondary | ICD-10-CM | POA: Diagnosis not present

## 2016-02-01 DIAGNOSIS — I10 Essential (primary) hypertension: Secondary | ICD-10-CM | POA: Diagnosis not present

## 2016-02-01 DIAGNOSIS — E782 Mixed hyperlipidemia: Secondary | ICD-10-CM | POA: Diagnosis not present

## 2016-07-10 DIAGNOSIS — H8110 Benign paroxysmal vertigo, unspecified ear: Secondary | ICD-10-CM | POA: Diagnosis not present

## 2016-07-10 DIAGNOSIS — M06212 Rheumatoid bursitis, left shoulder: Secondary | ICD-10-CM | POA: Diagnosis not present

## 2016-08-08 DIAGNOSIS — E559 Vitamin D deficiency, unspecified: Secondary | ICD-10-CM | POA: Diagnosis not present

## 2016-08-08 DIAGNOSIS — E782 Mixed hyperlipidemia: Secondary | ICD-10-CM | POA: Diagnosis not present

## 2016-08-10 DIAGNOSIS — Z0000 Encounter for general adult medical examination without abnormal findings: Secondary | ICD-10-CM | POA: Diagnosis not present

## 2016-08-10 DIAGNOSIS — Z23 Encounter for immunization: Secondary | ICD-10-CM | POA: Diagnosis not present

## 2016-08-10 DIAGNOSIS — I10 Essential (primary) hypertension: Secondary | ICD-10-CM | POA: Diagnosis not present

## 2016-08-10 DIAGNOSIS — E782 Mixed hyperlipidemia: Secondary | ICD-10-CM | POA: Diagnosis not present

## 2016-08-10 DIAGNOSIS — E559 Vitamin D deficiency, unspecified: Secondary | ICD-10-CM | POA: Diagnosis not present

## 2017-04-09 DIAGNOSIS — Z681 Body mass index (BMI) 19 or less, adult: Secondary | ICD-10-CM | POA: Diagnosis not present

## 2017-04-09 DIAGNOSIS — R251 Tremor, unspecified: Secondary | ICD-10-CM | POA: Diagnosis not present

## 2017-05-06 DIAGNOSIS — E559 Vitamin D deficiency, unspecified: Secondary | ICD-10-CM | POA: Diagnosis not present

## 2017-05-06 DIAGNOSIS — E872 Acidosis: Secondary | ICD-10-CM | POA: Diagnosis not present

## 2017-05-06 DIAGNOSIS — I10 Essential (primary) hypertension: Secondary | ICD-10-CM | POA: Diagnosis not present

## 2017-08-13 DIAGNOSIS — E559 Vitamin D deficiency, unspecified: Secondary | ICD-10-CM | POA: Diagnosis not present

## 2017-08-13 DIAGNOSIS — I10 Essential (primary) hypertension: Secondary | ICD-10-CM | POA: Diagnosis not present

## 2017-08-21 DIAGNOSIS — Z23 Encounter for immunization: Secondary | ICD-10-CM | POA: Diagnosis not present

## 2017-08-26 DIAGNOSIS — I10 Essential (primary) hypertension: Secondary | ICD-10-CM | POA: Diagnosis not present

## 2017-08-26 DIAGNOSIS — E559 Vitamin D deficiency, unspecified: Secondary | ICD-10-CM | POA: Diagnosis not present

## 2017-08-26 DIAGNOSIS — G250 Essential tremor: Secondary | ICD-10-CM | POA: Diagnosis not present

## 2017-08-26 DIAGNOSIS — Z0000 Encounter for general adult medical examination without abnormal findings: Secondary | ICD-10-CM | POA: Diagnosis not present

## 2017-08-26 DIAGNOSIS — E782 Mixed hyperlipidemia: Secondary | ICD-10-CM | POA: Diagnosis not present

## 2017-08-26 DIAGNOSIS — Z681 Body mass index (BMI) 19 or less, adult: Secondary | ICD-10-CM | POA: Diagnosis not present

## 2017-12-18 DIAGNOSIS — R461 Bizarre personal appearance: Secondary | ICD-10-CM | POA: Diagnosis not present

## 2017-12-18 DIAGNOSIS — E441 Mild protein-calorie malnutrition: Secondary | ICD-10-CM | POA: Diagnosis not present

## 2017-12-18 DIAGNOSIS — G250 Essential tremor: Secondary | ICD-10-CM | POA: Diagnosis not present

## 2017-12-18 DIAGNOSIS — E559 Vitamin D deficiency, unspecified: Secondary | ICD-10-CM | POA: Diagnosis not present

## 2017-12-18 DIAGNOSIS — I10 Essential (primary) hypertension: Secondary | ICD-10-CM | POA: Diagnosis not present

## 2017-12-18 DIAGNOSIS — E785 Hyperlipidemia, unspecified: Secondary | ICD-10-CM | POA: Diagnosis not present

## 2017-12-18 DIAGNOSIS — Z599 Problem related to housing and economic circumstances, unspecified: Secondary | ICD-10-CM | POA: Diagnosis not present

## 2017-12-18 DIAGNOSIS — Z681 Body mass index (BMI) 19 or less, adult: Secondary | ICD-10-CM | POA: Diagnosis not present

## 2018-01-01 ENCOUNTER — Emergency Department (HOSPITAL_COMMUNITY): Payer: Medicare HMO

## 2018-01-01 ENCOUNTER — Emergency Department (HOSPITAL_COMMUNITY)
Admission: EM | Admit: 2018-01-01 | Discharge: 2018-01-01 | Disposition: A | Discharge status: Home / Self Care | Payer: Medicare HMO | Attending: Emergency Medicine | Admitting: Emergency Medicine

## 2018-01-01 ENCOUNTER — Encounter (HOSPITAL_COMMUNITY): Payer: Self-pay

## 2018-01-01 DIAGNOSIS — Z79899 Other long term (current) drug therapy: Secondary | ICD-10-CM | POA: Insufficient documentation

## 2018-01-01 DIAGNOSIS — E860 Dehydration: Secondary | ICD-10-CM | POA: Diagnosis not present

## 2018-01-01 DIAGNOSIS — E876 Hypokalemia: Secondary | ICD-10-CM | POA: Diagnosis not present

## 2018-01-01 DIAGNOSIS — R05 Cough: Secondary | ICD-10-CM | POA: Diagnosis not present

## 2018-01-01 DIAGNOSIS — J45909 Unspecified asthma, uncomplicated: Secondary | ICD-10-CM | POA: Diagnosis not present

## 2018-01-01 DIAGNOSIS — M7989 Other specified soft tissue disorders: Secondary | ICD-10-CM | POA: Diagnosis not present

## 2018-01-01 DIAGNOSIS — Z87891 Personal history of nicotine dependence: Secondary | ICD-10-CM | POA: Diagnosis not present

## 2018-01-01 DIAGNOSIS — Z7401 Bed confinement status: Secondary | ICD-10-CM | POA: Diagnosis not present

## 2018-01-01 DIAGNOSIS — R059 Cough, unspecified: Secondary | ICD-10-CM

## 2018-01-01 DIAGNOSIS — R279 Unspecified lack of coordination: Secondary | ICD-10-CM | POA: Diagnosis not present

## 2018-01-01 DIAGNOSIS — R911 Solitary pulmonary nodule: Secondary | ICD-10-CM | POA: Diagnosis not present

## 2018-01-01 LAB — COMPREHENSIVE METABOLIC PANEL
ALBUMIN: 2.9 g/dL — AB (ref 3.5–5.0)
ALT: 10 U/L — AB (ref 14–54)
AST: 18 U/L (ref 15–41)
Alkaline Phosphatase: 77 U/L (ref 38–126)
Anion gap: 10 (ref 5–15)
BUN: 8 mg/dL (ref 6–20)
CALCIUM: 8.3 mg/dL — AB (ref 8.9–10.3)
CHLORIDE: 97 mmol/L — AB (ref 101–111)
CO2: 27 mmol/L (ref 22–32)
CREATININE: 0.71 mg/dL (ref 0.44–1.00)
GFR calc non Af Amer: >60 mL/min (ref >60)
GLUCOSE: 110 mg/dL — AB (ref 65–99)
Potassium: 2.9 mmol/L — ABNORMAL LOW (ref 3.5–5.1)
SODIUM: 134 mmol/L — AB (ref 135–145)
Total Bilirubin: 0.4 mg/dL (ref 0.3–1.2)
Total Protein: 6.9 g/dL (ref 6.5–8.1)

## 2018-01-01 LAB — CBC WITH DIFFERENTIAL/PLATELET
BASOS ABS: 0 10*3/uL (ref 0.0–0.1)
BASOS PCT: 0 %
EOS ABS: 0.1 10*3/uL (ref 0.0–0.7)
Eosinophils Relative: 1 %
HCT: 40.8 % (ref 36.0–46.0)
Hemoglobin: 12.9 g/dL (ref 12.0–15.0)
LYMPHS ABS: 1.5 10*3/uL (ref 0.7–4.0)
Lymphocytes Relative: 11 %
MCH: 28.2 pg (ref 26.0–34.0)
MCHC: 31.6 g/dL (ref 30.0–36.0)
MCV: 89.3 fL (ref 78.0–100.0)
Monocytes Absolute: 1.3 10*3/uL — ABNORMAL HIGH (ref 0.1–1.0)
Monocytes Relative: 9 %
NEUTROS PCT: 79 %
Neutro Abs: 11.6 10*3/uL — ABNORMAL HIGH (ref 1.7–7.7)
Platelets: 389 10*3/uL (ref 150–400)
RBC: 4.57 MIL/uL (ref 3.87–5.11)
RDW: 13.4 % (ref 11.5–15.5)
WBC: 14.6 10*3/uL — AB (ref 4.0–10.5)

## 2018-01-01 MED ORDER — SODIUM CHLORIDE 0.9 % IV BOLUS (SEPSIS)
500.0000 mL | Freq: Once | INTRAVENOUS | Status: AC
  Administered 2018-01-01: 500 mL via INTRAVENOUS

## 2018-01-01 MED ORDER — DOXYCYCLINE HYCLATE 100 MG PO TABS
100.0000 mg | ORAL_TABLET | Freq: Once | ORAL | Status: AC
  Administered 2018-01-01: 100 mg via ORAL
  Filled 2018-01-01: qty 1

## 2018-01-01 MED ORDER — POTASSIUM CHLORIDE CRYS ER 20 MEQ PO TBCR
40.0000 meq | EXTENDED_RELEASE_TABLET | Freq: Once | ORAL | Status: AC
Start: 2018-01-01 — End: 2018-01-01
  Administered 2018-01-01: 40 meq via ORAL
  Filled 2018-01-01: qty 2

## 2018-01-01 MED ORDER — DOXYCYCLINE HYCLATE 100 MG PO CAPS: ORAL_CAPSULE | ORAL | 0 refills | Status: AC

## 2018-01-01 MED ORDER — POTASSIUM CHLORIDE CRYS ER 20 MEQ PO TBCR: 20.0000 meq | EXTENDED_RELEASE_TABLET | Freq: Every day | ORAL | 0 refills | Status: AC

## 2018-01-01 MED ORDER — IOPAMIDOL (ISOVUE-300) INJECTION 61%
75.0000 mL | Freq: Once | INTRAVENOUS | Status: AC | PRN
  Administered 2018-01-01: 75 mL via INTRAVENOUS

## 2018-01-01 MED ORDER — KETOROLAC TROMETHAMINE 30 MG/ML IJ SOLN
15.0000 mg | Freq: Once | INTRAMUSCULAR | Status: AC
  Administered 2018-01-01: 15 mg via INTRAVENOUS
  Filled 2018-01-01: qty 1

## 2018-01-01 NOTE — ED Provider Notes (Signed)
[facility] EMERGENCY DEPARTMENT Provider Note   CSN: [id number] Arrival date & time: 01/01/18  1316     History   Chief Complaint Chief Complaint  Patient presents with  . Leg Swelling    HPI Amy [name] is a 84 y.o. female.  Patient complains of mild cough for a number days.  She is on her way to a nursing home today but the paramedics brought her here for evaluation   The history is provided by the patient and the EMS personnel.  Cough  This is a new problem. The current episode started more than 2 days ago. The problem occurs constantly. The cough is non-productive. There has been no fever. Pertinent negatives include no chest pain and no headaches. She has tried nothing for the symptoms.    Past Medical History:  Diagnosis Date  . Asthma   . Diverticulosis   . GERD (gastroesophageal reflux disease)   . Hiatal hernia   . Hyperplastic colon polyp   . IBS (irritable bowel syndrome)   . MVP (mitral valve prolapse)   . Schatzki's ring     Patient Active Problem List   Diagnosis Date Noted  . Dysphagia 11/29/2011  . Diarrhea 11/29/2011  . Esophageal dysphagia 09/03/2011  . GERD (gastroesophageal reflux disease) 09/03/2011  . Bowel habit changes 09/03/2011  . IBS (irritable bowel syndrome) 09/03/2011  . Rectal pain 09/03/2011    Past Surgical History:  Procedure Laterality Date  . ABDOMINAL HYSTERECTOMY    . CARDIAC CATHETERIZATION    . CATARACT EXTRACTION    . COLONOSCOPY  09/2006   normal TI, left sided diverticulosis, random bx negative for microscopic colitis  . COLONOSCOPY  12/19/2011   Procedure: COLONOSCOPY;  Surgeon: Daneil Dolin, MD;  Location: [facility];  Service: Endoscopy;  Laterality: N/A;  7:30  . ESOPHAGOGASTRODUODENOSCOPY  12/2002   Schatzki's ring s/p dilation, hh, gastric polyps (hyperplastic), H. Pylori serologies negative  . ESOPHAGOGASTRODUODENOSCOPY  12/19/2011   reflux,schatzki's ring, small hiatal hernia, fundal gland polyps  .  EXCISION MORTON'S NEUROMA    . FOOT SURGERY     right  . SINUS SURGERY WITH INSTATRAK      OB History    No data available       Home Medications    Prior to Admission medications   Medication Sig Start Date End Date Taking? Authorizing Provider  ibuprofen (ADVIL,MOTRIN) 200 MG tablet Take 200 mg by mouth every 6 (six) hours as needed. For pain    Yes [provider]  metoprolol (TOPROL-XL) 50 MG 24 hr tablet Take 100 mg by mouth daily.  08/09/11  Yes [provider]  Vitamin D, Ergocalciferol, (DRISDOL) 50000 units CAPS capsule Take 50,000 Units by mouth every 7 (seven) days.   Yes [provider]  albuterol (PROVENTIL HFA;VENTOLIN HFA) 108 (90 BASE) MCG/ACT inhaler Inhale 2 puffs into the lungs every 6 (six) hours as needed. For shortness of breath    [provider]  clidinium-chlordiazePOXIDE (LIBRAX) 2.5-5 MG per capsule Take 1 capsule by mouth 3 (three) times daily as needed. For abdominal cramps/diarrhea  09/03/11 09/02/12  Mahala Menghini, PA-C  doxycycline (VIBRAMYCIN) 100 MG capsule One po bid 01/01/18   Milton Ferguson, MD  pantoprazole (PROTONIX) 40 MG tablet Take 1 tablet (40 mg total) by mouth daily. 12/19/11 12/18/12  Daneil Dolin, MD  peg 3350 powder (MOVIPREP) 100 G SOLR Take 1 kit (100 g total) by mouth once. As directed Please purchase  1 Fleets enema to use with the prep Patient not taking: Reported on 01/01/2018 11/29/11   Rourk, Cristopher Estimable, MD  potassium chloride SA (K-DUR,KLOR-CON) 20 MEQ tablet Take 1 tablet (20 mEq total) by mouth daily. 01/01/18   Milton Ferguson, MD    Family History Family History  Problem Relation Age of Onset  . Colon cancer Neg Hx   . Liver disease Neg Hx     Social History Social History   Tobacco Use  . Smoking status: Former Smoker    Packs/day: 0.20    Types: Cigarettes  . Smokeless tobacco: Former Systems developer    Quit date: 12/03/1974  Substance Use Topics  . Alcohol use: No  . Drug use: No      Allergies   Aspirin; Latex; and Sulfa antibiotics   Review of Systems Review of Systems  Constitutional: Negative for appetite change and fatigue.  HENT: Negative for congestion, ear discharge and sinus pressure.   Eyes: Negative for discharge.  Respiratory: Positive for cough.   Cardiovascular: Negative for chest pain.  Gastrointestinal: Negative for abdominal pain and diarrhea.  Genitourinary: Negative for frequency and hematuria.  Musculoskeletal: Negative for back pain.  Skin: Negative for rash.  Neurological: Negative for seizures and headaches.  Psychiatric/Behavioral: Negative for hallucinations.     Physical Exam Updated Vital Signs BP (!) 150/71   Pulse 86   Temp 97.8 F (36.6 C) (Oral)   Resp 18   Wt 59 kg (130 lb)   SpO2 97%   BMI 21.63 kg/m   Physical Exam  Constitutional: She is oriented to person, place, and time. She appears well-developed.  HENT:  Head: Normocephalic.  Eyes: Conjunctivae and EOM are normal. No scleral icterus.  Neck: Neck supple. No thyromegaly present.  Cardiovascular: Normal rate and regular rhythm. Exam reveals no gallop and no friction rub.  No murmur heard. Pulmonary/Chest: No stridor. She has no wheezes. She has no rales. She exhibits no tenderness.  Abdominal: She exhibits no distension. There is no tenderness. There is no rebound.  Musculoskeletal: Normal range of motion. She exhibits no edema.  Lymphadenopathy:    She has no cervical adenopathy.  Neurological: She is oriented to person, place, and time. She exhibits normal muscle tone. Coordination normal.  Skin: No rash noted. No erythema.  Psychiatric: She has a normal mood and affect. Her behavior is normal.     ED Treatments / Results  Labs (all labs ordered are listed, but only abnormal results are displayed) Labs Reviewed  CBC WITH DIFFERENTIAL/PLATELET - Abnormal; Notable for the following components:      Result Value   WBC 14.6 (*)    Neutro Abs 11.6  (*)    Monocytes Absolute 1.3 (*)    All other components within normal limits  COMPREHENSIVE METABOLIC PANEL - Abnormal; Notable for the following components:   Sodium 134 (*)    Potassium 2.9 (*)    Chloride 97 (*)    Glucose, Bld 110 (*)    Calcium 8.3 (*)    Albumin 2.9 (*)    ALT 10 (*)    All other components within normal limits    EKG  EKG Interpretation None       Radiology Ct Chest W Contrast  Result Date: 01/01/2018 CLINICAL DATA:  Persistent cough. EXAM: CT CHEST WITH CONTRAST TECHNIQUE: Multidetector CT imaging of the chest was performed during intravenous contrast administration. CONTRAST:  56m ISOVUE-300 IOPAMIDOL (ISOVUE-300) INJECTION 61% COMPARISON:  Chest x-ray 01/01/2018.  CT  chest 10/14/2007 FINDINGS: Cardiovascular: Heart size normal. Coronary artery calcification is evident. Atherosclerotic calcification is noted in the wall of the thoracic aorta. Mediastinum/Nodes: 16 mm short axis subcarinal lymph node associated with 11 mm short axis right hilar lymph node. 13 mm short axis left hilar lymph node. The esophagus has normal imaging features. There is no axillary lymphadenopathy. Lungs/Pleura: Diffuse bronchial wall thickening is identified with numerous areas of bronchiectasis, airway impaction and relatively widespread tree in bud opacity in all lobes of both lungs. Bronchiectasis is most advanced in the right middle lobe and lingula. Areas of more confluent nodular opacity are noted bilaterally including a 17 mm right lower lobe nodule on image 63 and a 9 mm right middle lobe nodule on image 57. confluent airspace consolidation is seen in the posterior left lower lobe with a component of mild associated volume loss. Upper Abdomen: Laminar flow of unopacified blood identified in superior mesenteric vein. Musculoskeletal: Bone windows reveal no worrisome lytic or sclerotic osseous lesions. IMPRESSION: 1. Widespread circumferential bronchial wall thickening,  bronchiectasis, and areas of airway impaction. This is associated with diffuse tree-in-bud nodularity involving all lobes of both lungs. Features are probably related to advanced atypical infection (including MAI). 2. Some areas of nodularity have become more confluent including a 17 mm sub solid nodule in the right lower lobe. Close attention and follow-up will be required as neoplasm could have this appearance. Consider follow-up CT chest in 3 months after therapy to reassess. 3. Confluent volume loss and airspace consolidation in the posterior left lower lobe. Pneumonia is a concern. 4. Mild mediastinal and borderline hilar lymphadenopathy, presumably reactive. Attention on follow-up recommended. Electronically Signed   By: Misty Stanley M.D.   On: 01/01/2018 16:56   Dg Chest Port 1 View  Result Date: 01/01/2018 CLINICAL DATA:  Cough.  Former smoker with history of asthma. EXAM: PORTABLE CHEST 1 VIEW COMPARISON:  Chest CT 10/14/2007.  No previous chest radiographs. FINDINGS: Single AP view 1522 hr. The heart size and mediastinal contours are normal. There is mild aortic atherosclerosis. Patient has developed progressive reticulonodular densities in both lungs with an upper lobe predominance. There is asymmetric right apical density which has progressed. In addition, there are patchy densities at both lung bases. No significant pleural effusion or pneumothorax. IMPRESSION: Progressive diffuse reticulonodular densities with asymmetric right apical and bibasilar pulmonary opacities. Findings suggest chronic inflammation, possibly atypical mycobacterial infection. Superimposed acute pneumonia and underlying malignancy cannot be excluded by this study. Recommend chest CT for further evaluation. If there is clinical suspicion of acute infection, that might be delayed until appropriate antibiotic therapy has been completed. Electronically Signed   By: Richardean Sale M.D.   On: 01/01/2018 15:36   Dg Foot Complete  Left  Result Date: 01/01/2018 CLINICAL DATA:  84 y/o F; swelling of the left foot for 2 days. No known injury. EXAM: LEFT FOOT - COMPLETE 3+ VIEW COMPARISON:  None. FINDINGS: There is no evidence of fracture or dislocation. Plantar calcaneal bone spur. Bones are demineralized. Pes cavus. IMPRESSION: No acute fracture or dislocation identified. Electronically Signed   By: Kristine Garbe M.D.   On: 01/01/2018 14:27    Procedures Procedures (including critical care time)  Medications Ordered in ED Medications  potassium chloride SA (K-DUR,KLOR-CON) CR tablet 40 mEq (not administered)  doxycycline (VIBRA-TABS) tablet 100 mg (not administered)  sodium chloride 0.9 % bolus 500 mL (500 mLs Intravenous New Bag/Given 01/01/18 1354)  ketorolac (TORADOL) 30 MG/ML injection 15 mg (15 mg  Intravenous Given 01/01/18 1355)  iopamidol (ISOVUE-300) 61 % injection 75 mL (75 mLs Intravenous Contrast Given 01/01/18 1633)     Initial Impression / Assessment and Plan / ED Course  I have reviewed the triage vital signs and the nursing notes.  Pertinent labs & imaging results that were available during my care of the patient were reviewed by me and considered in my medical decision making (see chart for details).    Patient with possible pneumonia on chest x-ray most likely atypical pneumonia.  Patient also has hypokalemia.  Patient's vital signs are completely normal she will be treated with doxycycline as an outpatient will follow up with her PCP  Final Clinical Impressions(s) / ED Diagnoses   Final diagnoses:  Hypokalemia    ED Discharge Orders        Ordered    doxycycline (VIBRAMYCIN) 100 MG capsule     01/01/18 1752    potassium chloride SA (K-DUR,KLOR-CON) 20 MEQ tablet  Daily     01/01/18 1752       Milton Ferguson, MD 01/01/18 1755

## 2018-01-01 NOTE — Discharge Instructions (Signed)
Follow-up with your doctor next week for recheck of bronchitis.  And low potassium.  You will need another CAT scan of your chest in 3 months for further evaluation

## 2018-01-01 NOTE — ED Triage Notes (Signed)
Ems reports they were transporting pt from her home to [place].  EMS says DSS is now her guardian.  EMS says when they arrived pt appeared dehydrated and swelling noted to left foot and leg.  Pt denies pain.  Pt alert and oriented.

## 2018-01-01 NOTE — ED Notes (Signed)
Called for transport to [facility].  I may be awhile have a few calls in front of her.  Nurse informed.

## 2018-01-01 NOTE — ED Notes (Signed)
Amy [name] with [facility], [facility] called and said if pt is discharged from ED she can be sent to [phone] at [facility] in [place].

## 2018-01-01 NOTE — ED Notes (Signed)
Pt altered mental status unable to sign discharge papers.

## 2018-01-02 DIAGNOSIS — R41841 Cognitive communication deficit: Secondary | ICD-10-CM | POA: Diagnosis not present

## 2018-01-02 DIAGNOSIS — R1310 Dysphagia, unspecified: Secondary | ICD-10-CM | POA: Diagnosis not present

## 2018-01-02 DIAGNOSIS — I10 Essential (primary) hypertension: Secondary | ICD-10-CM | POA: Diagnosis not present

## 2018-01-02 DIAGNOSIS — M6281 Muscle weakness (generalized): Secondary | ICD-10-CM | POA: Diagnosis not present

## 2018-01-02 DIAGNOSIS — E559 Vitamin D deficiency, unspecified: Secondary | ICD-10-CM | POA: Diagnosis not present

## 2018-01-02 DIAGNOSIS — E785 Hyperlipidemia, unspecified: Secondary | ICD-10-CM | POA: Diagnosis not present

## 2018-01-02 DIAGNOSIS — F0390 Unspecified dementia without behavioral disturbance: Secondary | ICD-10-CM | POA: Diagnosis not present

## 2018-01-03 DIAGNOSIS — F0390 Unspecified dementia without behavioral disturbance: Secondary | ICD-10-CM | POA: Diagnosis not present

## 2018-01-03 DIAGNOSIS — R1310 Dysphagia, unspecified: Secondary | ICD-10-CM | POA: Diagnosis not present

## 2018-01-03 DIAGNOSIS — R41841 Cognitive communication deficit: Secondary | ICD-10-CM | POA: Diagnosis not present

## 2018-01-03 DIAGNOSIS — I10 Essential (primary) hypertension: Secondary | ICD-10-CM | POA: Diagnosis not present

## 2018-01-04 DIAGNOSIS — D649 Anemia, unspecified: Secondary | ICD-10-CM | POA: Diagnosis not present

## 2018-01-04 DIAGNOSIS — I10 Essential (primary) hypertension: Secondary | ICD-10-CM | POA: Diagnosis not present

## 2018-01-04 DIAGNOSIS — F0390 Unspecified dementia without behavioral disturbance: Secondary | ICD-10-CM | POA: Diagnosis not present

## 2018-01-04 DIAGNOSIS — E119 Type 2 diabetes mellitus without complications: Secondary | ICD-10-CM | POA: Diagnosis not present

## 2018-01-04 DIAGNOSIS — D518 Other vitamin B12 deficiency anemias: Secondary | ICD-10-CM | POA: Diagnosis not present

## 2018-01-04 DIAGNOSIS — E559 Vitamin D deficiency, unspecified: Secondary | ICD-10-CM | POA: Diagnosis not present

## 2018-01-04 DIAGNOSIS — E782 Mixed hyperlipidemia: Secondary | ICD-10-CM | POA: Diagnosis not present

## 2018-01-04 DIAGNOSIS — R1310 Dysphagia, unspecified: Secondary | ICD-10-CM | POA: Diagnosis not present

## 2018-01-04 DIAGNOSIS — R41841 Cognitive communication deficit: Secondary | ICD-10-CM | POA: Diagnosis not present

## 2018-01-04 DIAGNOSIS — Z79899 Other long term (current) drug therapy: Secondary | ICD-10-CM | POA: Diagnosis not present

## 2018-01-06 DIAGNOSIS — R41841 Cognitive communication deficit: Secondary | ICD-10-CM | POA: Diagnosis not present

## 2018-01-06 DIAGNOSIS — I10 Essential (primary) hypertension: Secondary | ICD-10-CM | POA: Diagnosis not present

## 2018-01-06 DIAGNOSIS — R1310 Dysphagia, unspecified: Secondary | ICD-10-CM | POA: Diagnosis not present

## 2018-01-06 DIAGNOSIS — F0390 Unspecified dementia without behavioral disturbance: Secondary | ICD-10-CM | POA: Diagnosis not present

## 2018-01-07 DIAGNOSIS — E559 Vitamin D deficiency, unspecified: Secondary | ICD-10-CM | POA: Diagnosis not present

## 2018-01-07 DIAGNOSIS — D72829 Elevated white blood cell count, unspecified: Secondary | ICD-10-CM | POA: Diagnosis not present

## 2018-01-07 DIAGNOSIS — M6281 Muscle weakness (generalized): Secondary | ICD-10-CM | POA: Diagnosis not present

## 2018-01-07 DIAGNOSIS — E039 Hypothyroidism, unspecified: Secondary | ICD-10-CM | POA: Diagnosis not present

## 2018-01-07 DIAGNOSIS — R1310 Dysphagia, unspecified: Secondary | ICD-10-CM | POA: Diagnosis not present

## 2018-01-07 DIAGNOSIS — F0390 Unspecified dementia without behavioral disturbance: Secondary | ICD-10-CM | POA: Diagnosis not present

## 2018-01-07 DIAGNOSIS — I10 Essential (primary) hypertension: Secondary | ICD-10-CM | POA: Diagnosis not present

## 2018-01-07 DIAGNOSIS — R41841 Cognitive communication deficit: Secondary | ICD-10-CM | POA: Diagnosis not present

## 2018-01-08 DIAGNOSIS — F0390 Unspecified dementia without behavioral disturbance: Secondary | ICD-10-CM | POA: Diagnosis not present

## 2018-01-08 DIAGNOSIS — R41841 Cognitive communication deficit: Secondary | ICD-10-CM | POA: Diagnosis not present

## 2018-01-08 DIAGNOSIS — R1310 Dysphagia, unspecified: Secondary | ICD-10-CM | POA: Diagnosis not present

## 2018-01-08 DIAGNOSIS — I10 Essential (primary) hypertension: Secondary | ICD-10-CM | POA: Diagnosis not present

## 2018-01-09 DIAGNOSIS — I10 Essential (primary) hypertension: Secondary | ICD-10-CM | POA: Diagnosis not present

## 2018-01-09 DIAGNOSIS — R1310 Dysphagia, unspecified: Secondary | ICD-10-CM | POA: Diagnosis not present

## 2018-01-09 DIAGNOSIS — R41841 Cognitive communication deficit: Secondary | ICD-10-CM | POA: Diagnosis not present

## 2018-01-09 DIAGNOSIS — R0989 Other specified symptoms and signs involving the circulatory and respiratory systems: Secondary | ICD-10-CM | POA: Diagnosis not present

## 2018-01-09 DIAGNOSIS — F0390 Unspecified dementia without behavioral disturbance: Secondary | ICD-10-CM | POA: Diagnosis not present

## 2018-01-10 DIAGNOSIS — I10 Essential (primary) hypertension: Secondary | ICD-10-CM | POA: Diagnosis not present

## 2018-01-10 DIAGNOSIS — R1310 Dysphagia, unspecified: Secondary | ICD-10-CM | POA: Diagnosis not present

## 2018-01-10 DIAGNOSIS — F0390 Unspecified dementia without behavioral disturbance: Secondary | ICD-10-CM | POA: Diagnosis not present

## 2018-01-10 DIAGNOSIS — R41841 Cognitive communication deficit: Secondary | ICD-10-CM | POA: Diagnosis not present

## 2018-01-11 DIAGNOSIS — I10 Essential (primary) hypertension: Secondary | ICD-10-CM | POA: Diagnosis not present

## 2018-01-11 DIAGNOSIS — R1310 Dysphagia, unspecified: Secondary | ICD-10-CM | POA: Diagnosis not present

## 2018-01-11 DIAGNOSIS — R41841 Cognitive communication deficit: Secondary | ICD-10-CM | POA: Diagnosis not present

## 2018-01-11 DIAGNOSIS — F0390 Unspecified dementia without behavioral disturbance: Secondary | ICD-10-CM | POA: Diagnosis not present

## 2018-01-12 DIAGNOSIS — R41841 Cognitive communication deficit: Secondary | ICD-10-CM | POA: Diagnosis not present

## 2018-01-12 DIAGNOSIS — I10 Essential (primary) hypertension: Secondary | ICD-10-CM | POA: Diagnosis not present

## 2018-01-12 DIAGNOSIS — R1310 Dysphagia, unspecified: Secondary | ICD-10-CM | POA: Diagnosis not present

## 2018-01-12 DIAGNOSIS — F0390 Unspecified dementia without behavioral disturbance: Secondary | ICD-10-CM | POA: Diagnosis not present

## 2018-01-13 DIAGNOSIS — I10 Essential (primary) hypertension: Secondary | ICD-10-CM | POA: Diagnosis not present

## 2018-01-13 DIAGNOSIS — R41841 Cognitive communication deficit: Secondary | ICD-10-CM | POA: Diagnosis not present

## 2018-01-13 DIAGNOSIS — F0390 Unspecified dementia without behavioral disturbance: Secondary | ICD-10-CM | POA: Diagnosis not present

## 2018-01-13 DIAGNOSIS — R1310 Dysphagia, unspecified: Secondary | ICD-10-CM | POA: Diagnosis not present

## 2018-01-14 DIAGNOSIS — F0390 Unspecified dementia without behavioral disturbance: Secondary | ICD-10-CM | POA: Diagnosis not present

## 2018-01-14 DIAGNOSIS — R41841 Cognitive communication deficit: Secondary | ICD-10-CM | POA: Diagnosis not present

## 2018-01-14 DIAGNOSIS — I10 Essential (primary) hypertension: Secondary | ICD-10-CM | POA: Diagnosis not present

## 2018-01-14 DIAGNOSIS — R1310 Dysphagia, unspecified: Secondary | ICD-10-CM | POA: Diagnosis not present

## 2018-01-15 DIAGNOSIS — D649 Anemia, unspecified: Secondary | ICD-10-CM | POA: Diagnosis not present

## 2018-01-15 DIAGNOSIS — Z79899 Other long term (current) drug therapy: Secondary | ICD-10-CM | POA: Diagnosis not present

## 2018-01-15 DIAGNOSIS — R41841 Cognitive communication deficit: Secondary | ICD-10-CM | POA: Diagnosis not present

## 2018-01-15 DIAGNOSIS — R1310 Dysphagia, unspecified: Secondary | ICD-10-CM | POA: Diagnosis not present

## 2018-01-15 DIAGNOSIS — R0989 Other specified symptoms and signs involving the circulatory and respiratory systems: Secondary | ICD-10-CM | POA: Diagnosis not present

## 2018-01-15 DIAGNOSIS — F0390 Unspecified dementia without behavioral disturbance: Secondary | ICD-10-CM | POA: Diagnosis not present

## 2018-01-15 DIAGNOSIS — Z09 Encounter for follow-up examination after completed treatment for conditions other than malignant neoplasm: Secondary | ICD-10-CM | POA: Diagnosis not present

## 2018-01-15 DIAGNOSIS — I10 Essential (primary) hypertension: Secondary | ICD-10-CM | POA: Diagnosis not present

## 2018-01-16 DIAGNOSIS — R1310 Dysphagia, unspecified: Secondary | ICD-10-CM | POA: Diagnosis not present

## 2018-01-16 DIAGNOSIS — E039 Hypothyroidism, unspecified: Secondary | ICD-10-CM | POA: Diagnosis not present

## 2018-01-16 DIAGNOSIS — R41841 Cognitive communication deficit: Secondary | ICD-10-CM | POA: Diagnosis not present

## 2018-01-16 DIAGNOSIS — E785 Hyperlipidemia, unspecified: Secondary | ICD-10-CM | POA: Diagnosis not present

## 2018-01-16 DIAGNOSIS — E559 Vitamin D deficiency, unspecified: Secondary | ICD-10-CM | POA: Diagnosis not present

## 2018-01-16 DIAGNOSIS — F0390 Unspecified dementia without behavioral disturbance: Secondary | ICD-10-CM | POA: Diagnosis not present

## 2018-01-16 DIAGNOSIS — I10 Essential (primary) hypertension: Secondary | ICD-10-CM | POA: Diagnosis not present

## 2018-01-16 DIAGNOSIS — M6281 Muscle weakness (generalized): Secondary | ICD-10-CM | POA: Diagnosis not present

## 2018-01-17 DIAGNOSIS — R1310 Dysphagia, unspecified: Secondary | ICD-10-CM | POA: Diagnosis not present

## 2018-01-17 DIAGNOSIS — R41841 Cognitive communication deficit: Secondary | ICD-10-CM | POA: Diagnosis not present

## 2018-01-17 DIAGNOSIS — I10 Essential (primary) hypertension: Secondary | ICD-10-CM | POA: Diagnosis not present

## 2018-01-17 DIAGNOSIS — F0390 Unspecified dementia without behavioral disturbance: Secondary | ICD-10-CM | POA: Diagnosis not present

## 2018-01-18 DIAGNOSIS — R41841 Cognitive communication deficit: Secondary | ICD-10-CM | POA: Diagnosis not present

## 2018-01-18 DIAGNOSIS — R1310 Dysphagia, unspecified: Secondary | ICD-10-CM | POA: Diagnosis not present

## 2018-01-18 DIAGNOSIS — F0390 Unspecified dementia without behavioral disturbance: Secondary | ICD-10-CM | POA: Diagnosis not present

## 2018-01-18 DIAGNOSIS — I10 Essential (primary) hypertension: Secondary | ICD-10-CM | POA: Diagnosis not present

## 2018-01-21 DIAGNOSIS — E039 Hypothyroidism, unspecified: Secondary | ICD-10-CM | POA: Diagnosis not present

## 2018-01-21 DIAGNOSIS — E1310 Other specified diabetes mellitus with ketoacidosis without coma: Secondary | ICD-10-CM | POA: Diagnosis not present

## 2018-01-21 DIAGNOSIS — F0390 Unspecified dementia without behavioral disturbance: Secondary | ICD-10-CM | POA: Diagnosis not present

## 2018-01-21 DIAGNOSIS — M6281 Muscle weakness (generalized): Secondary | ICD-10-CM | POA: Diagnosis not present

## 2018-01-22 DIAGNOSIS — R636 Underweight: Secondary | ICD-10-CM | POA: Diagnosis not present

## 2018-01-22 DIAGNOSIS — D519 Vitamin B12 deficiency anemia, unspecified: Secondary | ICD-10-CM | POA: Diagnosis not present

## 2018-01-22 DIAGNOSIS — I10 Essential (primary) hypertension: Secondary | ICD-10-CM | POA: Diagnosis not present

## 2018-01-22 DIAGNOSIS — E039 Hypothyroidism, unspecified: Secondary | ICD-10-CM | POA: Diagnosis not present

## 2018-01-22 DIAGNOSIS — N390 Urinary tract infection, site not specified: Secondary | ICD-10-CM | POA: Diagnosis not present

## 2018-01-23 DIAGNOSIS — G500 Trigeminal neuralgia: Secondary | ICD-10-CM | POA: Diagnosis not present

## 2018-01-23 DIAGNOSIS — F341 Dysthymic disorder: Secondary | ICD-10-CM | POA: Diagnosis not present

## 2018-01-23 DIAGNOSIS — E039 Hypothyroidism, unspecified: Secondary | ICD-10-CM | POA: Diagnosis not present

## 2018-01-23 DIAGNOSIS — R1310 Dysphagia, unspecified: Secondary | ICD-10-CM | POA: Diagnosis not present

## 2018-01-23 DIAGNOSIS — F0280 Dementia in other diseases classified elsewhere without behavioral disturbance: Secondary | ICD-10-CM | POA: Diagnosis not present

## 2018-01-23 DIAGNOSIS — I10 Essential (primary) hypertension: Secondary | ICD-10-CM | POA: Diagnosis not present

## 2018-01-24 DIAGNOSIS — I10 Essential (primary) hypertension: Secondary | ICD-10-CM | POA: Diagnosis not present

## 2018-01-24 DIAGNOSIS — F341 Dysthymic disorder: Secondary | ICD-10-CM | POA: Diagnosis not present

## 2018-01-24 DIAGNOSIS — E039 Hypothyroidism, unspecified: Secondary | ICD-10-CM | POA: Diagnosis not present

## 2018-01-24 DIAGNOSIS — G500 Trigeminal neuralgia: Secondary | ICD-10-CM | POA: Diagnosis not present

## 2018-01-24 DIAGNOSIS — R1310 Dysphagia, unspecified: Secondary | ICD-10-CM | POA: Diagnosis not present

## 2018-01-24 DIAGNOSIS — F0280 Dementia in other diseases classified elsewhere without behavioral disturbance: Secondary | ICD-10-CM | POA: Diagnosis not present

## 2018-01-25 DIAGNOSIS — F341 Dysthymic disorder: Secondary | ICD-10-CM | POA: Diagnosis not present

## 2018-01-25 DIAGNOSIS — I10 Essential (primary) hypertension: Secondary | ICD-10-CM | POA: Diagnosis not present

## 2018-01-25 DIAGNOSIS — R1310 Dysphagia, unspecified: Secondary | ICD-10-CM | POA: Diagnosis not present

## 2018-01-25 DIAGNOSIS — F0280 Dementia in other diseases classified elsewhere without behavioral disturbance: Secondary | ICD-10-CM | POA: Diagnosis not present

## 2018-01-25 DIAGNOSIS — G500 Trigeminal neuralgia: Secondary | ICD-10-CM | POA: Diagnosis not present

## 2018-01-25 DIAGNOSIS — E039 Hypothyroidism, unspecified: Secondary | ICD-10-CM | POA: Diagnosis not present

## 2018-01-29 DIAGNOSIS — F0280 Dementia in other diseases classified elsewhere without behavioral disturbance: Secondary | ICD-10-CM | POA: Diagnosis not present

## 2018-01-29 DIAGNOSIS — R1310 Dysphagia, unspecified: Secondary | ICD-10-CM | POA: Diagnosis not present

## 2018-01-29 DIAGNOSIS — G500 Trigeminal neuralgia: Secondary | ICD-10-CM | POA: Diagnosis not present

## 2018-01-29 DIAGNOSIS — I10 Essential (primary) hypertension: Secondary | ICD-10-CM | POA: Diagnosis not present

## 2018-01-29 DIAGNOSIS — F341 Dysthymic disorder: Secondary | ICD-10-CM | POA: Diagnosis not present

## 2018-01-29 DIAGNOSIS — E039 Hypothyroidism, unspecified: Secondary | ICD-10-CM | POA: Diagnosis not present

## 2018-01-30 DIAGNOSIS — F0280 Dementia in other diseases classified elsewhere without behavioral disturbance: Secondary | ICD-10-CM | POA: Diagnosis not present

## 2018-01-30 DIAGNOSIS — R1310 Dysphagia, unspecified: Secondary | ICD-10-CM | POA: Diagnosis not present

## 2018-01-30 DIAGNOSIS — E039 Hypothyroidism, unspecified: Secondary | ICD-10-CM | POA: Diagnosis not present

## 2018-01-30 DIAGNOSIS — I10 Essential (primary) hypertension: Secondary | ICD-10-CM | POA: Diagnosis not present

## 2018-01-30 DIAGNOSIS — F341 Dysthymic disorder: Secondary | ICD-10-CM | POA: Diagnosis not present

## 2018-01-30 DIAGNOSIS — G500 Trigeminal neuralgia: Secondary | ICD-10-CM | POA: Diagnosis not present

## 2018-01-31 DIAGNOSIS — F341 Dysthymic disorder: Secondary | ICD-10-CM | POA: Diagnosis not present

## 2018-01-31 DIAGNOSIS — G500 Trigeminal neuralgia: Secondary | ICD-10-CM | POA: Diagnosis not present

## 2018-01-31 DIAGNOSIS — F0280 Dementia in other diseases classified elsewhere without behavioral disturbance: Secondary | ICD-10-CM | POA: Diagnosis not present

## 2018-01-31 DIAGNOSIS — I10 Essential (primary) hypertension: Secondary | ICD-10-CM | POA: Diagnosis not present

## 2018-01-31 DIAGNOSIS — R1310 Dysphagia, unspecified: Secondary | ICD-10-CM | POA: Diagnosis not present

## 2018-01-31 DIAGNOSIS — E039 Hypothyroidism, unspecified: Secondary | ICD-10-CM | POA: Diagnosis not present

## 2018-02-04 DIAGNOSIS — G500 Trigeminal neuralgia: Secondary | ICD-10-CM | POA: Diagnosis not present

## 2018-02-04 DIAGNOSIS — E039 Hypothyroidism, unspecified: Secondary | ICD-10-CM | POA: Diagnosis not present

## 2018-02-04 DIAGNOSIS — I10 Essential (primary) hypertension: Secondary | ICD-10-CM | POA: Diagnosis not present

## 2018-02-04 DIAGNOSIS — F0280 Dementia in other diseases classified elsewhere without behavioral disturbance: Secondary | ICD-10-CM | POA: Diagnosis not present

## 2018-02-04 DIAGNOSIS — R1310 Dysphagia, unspecified: Secondary | ICD-10-CM | POA: Diagnosis not present

## 2018-02-04 DIAGNOSIS — F341 Dysthymic disorder: Secondary | ICD-10-CM | POA: Diagnosis not present

## 2018-02-06 DIAGNOSIS — R1310 Dysphagia, unspecified: Secondary | ICD-10-CM | POA: Diagnosis not present

## 2018-02-06 DIAGNOSIS — G500 Trigeminal neuralgia: Secondary | ICD-10-CM | POA: Diagnosis not present

## 2018-02-06 DIAGNOSIS — I10 Essential (primary) hypertension: Secondary | ICD-10-CM | POA: Diagnosis not present

## 2018-02-06 DIAGNOSIS — E039 Hypothyroidism, unspecified: Secondary | ICD-10-CM | POA: Diagnosis not present

## 2018-02-06 DIAGNOSIS — F0280 Dementia in other diseases classified elsewhere without behavioral disturbance: Secondary | ICD-10-CM | POA: Diagnosis not present

## 2018-02-06 DIAGNOSIS — F341 Dysthymic disorder: Secondary | ICD-10-CM | POA: Diagnosis not present

## 2018-02-07 DIAGNOSIS — F0280 Dementia in other diseases classified elsewhere without behavioral disturbance: Secondary | ICD-10-CM | POA: Diagnosis not present

## 2018-02-07 DIAGNOSIS — R1310 Dysphagia, unspecified: Secondary | ICD-10-CM | POA: Diagnosis not present

## 2018-02-07 DIAGNOSIS — F341 Dysthymic disorder: Secondary | ICD-10-CM | POA: Diagnosis not present

## 2018-02-07 DIAGNOSIS — I10 Essential (primary) hypertension: Secondary | ICD-10-CM | POA: Diagnosis not present

## 2018-02-07 DIAGNOSIS — E039 Hypothyroidism, unspecified: Secondary | ICD-10-CM | POA: Diagnosis not present

## 2018-02-07 DIAGNOSIS — G500 Trigeminal neuralgia: Secondary | ICD-10-CM | POA: Diagnosis not present

## 2018-02-10 DIAGNOSIS — G500 Trigeminal neuralgia: Secondary | ICD-10-CM | POA: Diagnosis not present

## 2018-02-10 DIAGNOSIS — F0280 Dementia in other diseases classified elsewhere without behavioral disturbance: Secondary | ICD-10-CM | POA: Diagnosis not present

## 2018-02-10 DIAGNOSIS — R1310 Dysphagia, unspecified: Secondary | ICD-10-CM | POA: Diagnosis not present

## 2018-02-10 DIAGNOSIS — I10 Essential (primary) hypertension: Secondary | ICD-10-CM | POA: Diagnosis not present

## 2018-02-10 DIAGNOSIS — F341 Dysthymic disorder: Secondary | ICD-10-CM | POA: Diagnosis not present

## 2018-02-10 DIAGNOSIS — E039 Hypothyroidism, unspecified: Secondary | ICD-10-CM | POA: Diagnosis not present

## 2018-02-11 DIAGNOSIS — F341 Dysthymic disorder: Secondary | ICD-10-CM | POA: Diagnosis not present

## 2018-02-11 DIAGNOSIS — G500 Trigeminal neuralgia: Secondary | ICD-10-CM | POA: Diagnosis not present

## 2018-02-11 DIAGNOSIS — R1310 Dysphagia, unspecified: Secondary | ICD-10-CM | POA: Diagnosis not present

## 2018-02-11 DIAGNOSIS — F0280 Dementia in other diseases classified elsewhere without behavioral disturbance: Secondary | ICD-10-CM | POA: Diagnosis not present

## 2018-02-11 DIAGNOSIS — E039 Hypothyroidism, unspecified: Secondary | ICD-10-CM | POA: Diagnosis not present

## 2018-02-11 DIAGNOSIS — I10 Essential (primary) hypertension: Secondary | ICD-10-CM | POA: Diagnosis not present

## 2018-02-12 DIAGNOSIS — E039 Hypothyroidism, unspecified: Secondary | ICD-10-CM | POA: Diagnosis not present

## 2018-02-12 DIAGNOSIS — I10 Essential (primary) hypertension: Secondary | ICD-10-CM | POA: Diagnosis not present

## 2018-02-12 DIAGNOSIS — R1310 Dysphagia, unspecified: Secondary | ICD-10-CM | POA: Diagnosis not present

## 2018-02-12 DIAGNOSIS — G500 Trigeminal neuralgia: Secondary | ICD-10-CM | POA: Diagnosis not present

## 2018-02-12 DIAGNOSIS — F341 Dysthymic disorder: Secondary | ICD-10-CM | POA: Diagnosis not present

## 2018-02-12 DIAGNOSIS — F0280 Dementia in other diseases classified elsewhere without behavioral disturbance: Secondary | ICD-10-CM | POA: Diagnosis not present

## 2018-02-13 DIAGNOSIS — I10 Essential (primary) hypertension: Secondary | ICD-10-CM | POA: Diagnosis not present

## 2018-02-13 DIAGNOSIS — F0280 Dementia in other diseases classified elsewhere without behavioral disturbance: Secondary | ICD-10-CM | POA: Diagnosis not present

## 2018-02-13 DIAGNOSIS — F341 Dysthymic disorder: Secondary | ICD-10-CM | POA: Diagnosis not present

## 2018-02-13 DIAGNOSIS — E039 Hypothyroidism, unspecified: Secondary | ICD-10-CM | POA: Diagnosis not present

## 2018-02-13 DIAGNOSIS — R1310 Dysphagia, unspecified: Secondary | ICD-10-CM | POA: Diagnosis not present

## 2018-02-13 DIAGNOSIS — G500 Trigeminal neuralgia: Secondary | ICD-10-CM | POA: Diagnosis not present

## 2018-02-17 DIAGNOSIS — F0280 Dementia in other diseases classified elsewhere without behavioral disturbance: Secondary | ICD-10-CM | POA: Diagnosis not present

## 2018-02-17 DIAGNOSIS — G500 Trigeminal neuralgia: Secondary | ICD-10-CM | POA: Diagnosis not present

## 2018-02-17 DIAGNOSIS — F341 Dysthymic disorder: Secondary | ICD-10-CM | POA: Diagnosis not present

## 2018-02-17 DIAGNOSIS — I10 Essential (primary) hypertension: Secondary | ICD-10-CM | POA: Diagnosis not present

## 2018-02-17 DIAGNOSIS — E039 Hypothyroidism, unspecified: Secondary | ICD-10-CM | POA: Diagnosis not present

## 2018-02-17 DIAGNOSIS — R1310 Dysphagia, unspecified: Secondary | ICD-10-CM | POA: Diagnosis not present

## 2018-02-19 DIAGNOSIS — G500 Trigeminal neuralgia: Secondary | ICD-10-CM | POA: Diagnosis not present

## 2018-02-19 DIAGNOSIS — F0280 Dementia in other diseases classified elsewhere without behavioral disturbance: Secondary | ICD-10-CM | POA: Diagnosis not present

## 2018-02-19 DIAGNOSIS — R1310 Dysphagia, unspecified: Secondary | ICD-10-CM | POA: Diagnosis not present

## 2018-02-19 DIAGNOSIS — E039 Hypothyroidism, unspecified: Secondary | ICD-10-CM | POA: Diagnosis not present

## 2018-02-19 DIAGNOSIS — F341 Dysthymic disorder: Secondary | ICD-10-CM | POA: Diagnosis not present

## 2018-02-19 DIAGNOSIS — I10 Essential (primary) hypertension: Secondary | ICD-10-CM | POA: Diagnosis not present

## 2018-02-20 DIAGNOSIS — F341 Dysthymic disorder: Secondary | ICD-10-CM | POA: Diagnosis not present

## 2018-02-20 DIAGNOSIS — E039 Hypothyroidism, unspecified: Secondary | ICD-10-CM | POA: Diagnosis not present

## 2018-02-20 DIAGNOSIS — G500 Trigeminal neuralgia: Secondary | ICD-10-CM | POA: Diagnosis not present

## 2018-02-20 DIAGNOSIS — F0280 Dementia in other diseases classified elsewhere without behavioral disturbance: Secondary | ICD-10-CM | POA: Diagnosis not present

## 2018-02-20 DIAGNOSIS — I10 Essential (primary) hypertension: Secondary | ICD-10-CM | POA: Diagnosis not present

## 2018-02-20 DIAGNOSIS — R1310 Dysphagia, unspecified: Secondary | ICD-10-CM | POA: Diagnosis not present

## 2018-02-21 DIAGNOSIS — E039 Hypothyroidism, unspecified: Secondary | ICD-10-CM | POA: Diagnosis not present

## 2018-02-21 DIAGNOSIS — G500 Trigeminal neuralgia: Secondary | ICD-10-CM | POA: Diagnosis not present

## 2018-02-21 DIAGNOSIS — F341 Dysthymic disorder: Secondary | ICD-10-CM | POA: Diagnosis not present

## 2018-02-21 DIAGNOSIS — F0280 Dementia in other diseases classified elsewhere without behavioral disturbance: Secondary | ICD-10-CM | POA: Diagnosis not present

## 2018-02-21 DIAGNOSIS — R1310 Dysphagia, unspecified: Secondary | ICD-10-CM | POA: Diagnosis not present

## 2018-02-21 DIAGNOSIS — I10 Essential (primary) hypertension: Secondary | ICD-10-CM | POA: Diagnosis not present

## 2018-02-26 DIAGNOSIS — E039 Hypothyroidism, unspecified: Secondary | ICD-10-CM | POA: Diagnosis not present

## 2018-02-26 DIAGNOSIS — G500 Trigeminal neuralgia: Secondary | ICD-10-CM | POA: Diagnosis not present

## 2018-02-26 DIAGNOSIS — F341 Dysthymic disorder: Secondary | ICD-10-CM | POA: Diagnosis not present

## 2018-02-26 DIAGNOSIS — I10 Essential (primary) hypertension: Secondary | ICD-10-CM | POA: Diagnosis not present

## 2018-02-26 DIAGNOSIS — R1310 Dysphagia, unspecified: Secondary | ICD-10-CM | POA: Diagnosis not present

## 2018-02-26 DIAGNOSIS — F0280 Dementia in other diseases classified elsewhere without behavioral disturbance: Secondary | ICD-10-CM | POA: Diagnosis not present

## 2018-02-27 DIAGNOSIS — F341 Dysthymic disorder: Secondary | ICD-10-CM | POA: Diagnosis not present

## 2018-02-27 DIAGNOSIS — I10 Essential (primary) hypertension: Secondary | ICD-10-CM | POA: Diagnosis not present

## 2018-02-27 DIAGNOSIS — J00 Acute nasopharyngitis [common cold]: Secondary | ICD-10-CM | POA: Diagnosis not present

## 2018-02-27 DIAGNOSIS — Z0000 Encounter for general adult medical examination without abnormal findings: Secondary | ICD-10-CM | POA: Diagnosis not present

## 2018-02-27 DIAGNOSIS — R1310 Dysphagia, unspecified: Secondary | ICD-10-CM | POA: Diagnosis not present

## 2018-02-27 DIAGNOSIS — Z23 Encounter for immunization: Secondary | ICD-10-CM | POA: Diagnosis not present

## 2018-02-27 DIAGNOSIS — D518 Other vitamin B12 deficiency anemias: Secondary | ICD-10-CM | POA: Diagnosis not present

## 2018-02-27 DIAGNOSIS — R251 Tremor, unspecified: Secondary | ICD-10-CM | POA: Diagnosis not present

## 2018-02-27 DIAGNOSIS — F0280 Dementia in other diseases classified elsewhere without behavioral disturbance: Secondary | ICD-10-CM | POA: Diagnosis not present

## 2018-02-27 DIAGNOSIS — E039 Hypothyroidism, unspecified: Secondary | ICD-10-CM | POA: Diagnosis not present

## 2018-02-27 DIAGNOSIS — G500 Trigeminal neuralgia: Secondary | ICD-10-CM | POA: Diagnosis not present

## 2018-02-27 DIAGNOSIS — F0390 Unspecified dementia without behavioral disturbance: Secondary | ICD-10-CM | POA: Diagnosis not present

## 2018-03-04 DIAGNOSIS — R1310 Dysphagia, unspecified: Secondary | ICD-10-CM | POA: Diagnosis not present

## 2018-03-04 DIAGNOSIS — F0280 Dementia in other diseases classified elsewhere without behavioral disturbance: Secondary | ICD-10-CM | POA: Diagnosis not present

## 2018-03-04 DIAGNOSIS — E039 Hypothyroidism, unspecified: Secondary | ICD-10-CM | POA: Diagnosis not present

## 2018-03-04 DIAGNOSIS — I10 Essential (primary) hypertension: Secondary | ICD-10-CM | POA: Diagnosis not present

## 2018-03-04 DIAGNOSIS — G500 Trigeminal neuralgia: Secondary | ICD-10-CM | POA: Diagnosis not present

## 2018-03-04 DIAGNOSIS — F341 Dysthymic disorder: Secondary | ICD-10-CM | POA: Diagnosis not present

## 2018-03-05 DIAGNOSIS — F0280 Dementia in other diseases classified elsewhere without behavioral disturbance: Secondary | ICD-10-CM | POA: Diagnosis not present

## 2018-03-05 DIAGNOSIS — G500 Trigeminal neuralgia: Secondary | ICD-10-CM | POA: Diagnosis not present

## 2018-03-05 DIAGNOSIS — I10 Essential (primary) hypertension: Secondary | ICD-10-CM | POA: Diagnosis not present

## 2018-03-05 DIAGNOSIS — E039 Hypothyroidism, unspecified: Secondary | ICD-10-CM | POA: Diagnosis not present

## 2018-03-05 DIAGNOSIS — R1310 Dysphagia, unspecified: Secondary | ICD-10-CM | POA: Diagnosis not present

## 2018-03-05 DIAGNOSIS — F341 Dysthymic disorder: Secondary | ICD-10-CM | POA: Diagnosis not present

## 2018-03-06 DIAGNOSIS — G500 Trigeminal neuralgia: Secondary | ICD-10-CM | POA: Diagnosis not present

## 2018-03-06 DIAGNOSIS — R1310 Dysphagia, unspecified: Secondary | ICD-10-CM | POA: Diagnosis not present

## 2018-03-06 DIAGNOSIS — I10 Essential (primary) hypertension: Secondary | ICD-10-CM | POA: Diagnosis not present

## 2018-03-06 DIAGNOSIS — F0280 Dementia in other diseases classified elsewhere without behavioral disturbance: Secondary | ICD-10-CM | POA: Diagnosis not present

## 2018-03-06 DIAGNOSIS — E039 Hypothyroidism, unspecified: Secondary | ICD-10-CM | POA: Diagnosis not present

## 2018-03-06 DIAGNOSIS — F341 Dysthymic disorder: Secondary | ICD-10-CM | POA: Diagnosis not present

## 2018-03-12 DIAGNOSIS — E039 Hypothyroidism, unspecified: Secondary | ICD-10-CM | POA: Diagnosis not present

## 2018-03-12 DIAGNOSIS — R1310 Dysphagia, unspecified: Secondary | ICD-10-CM | POA: Diagnosis not present

## 2018-03-12 DIAGNOSIS — I10 Essential (primary) hypertension: Secondary | ICD-10-CM | POA: Diagnosis not present

## 2018-03-12 DIAGNOSIS — F341 Dysthymic disorder: Secondary | ICD-10-CM | POA: Diagnosis not present

## 2018-03-12 DIAGNOSIS — F0280 Dementia in other diseases classified elsewhere without behavioral disturbance: Secondary | ICD-10-CM | POA: Diagnosis not present

## 2018-03-12 DIAGNOSIS — G500 Trigeminal neuralgia: Secondary | ICD-10-CM | POA: Diagnosis not present

## 2018-03-13 DIAGNOSIS — R1310 Dysphagia, unspecified: Secondary | ICD-10-CM | POA: Diagnosis not present

## 2018-03-13 DIAGNOSIS — I10 Essential (primary) hypertension: Secondary | ICD-10-CM | POA: Diagnosis not present

## 2018-03-13 DIAGNOSIS — F0280 Dementia in other diseases classified elsewhere without behavioral disturbance: Secondary | ICD-10-CM | POA: Diagnosis not present

## 2018-03-13 DIAGNOSIS — G500 Trigeminal neuralgia: Secondary | ICD-10-CM | POA: Diagnosis not present

## 2018-03-13 DIAGNOSIS — E039 Hypothyroidism, unspecified: Secondary | ICD-10-CM | POA: Diagnosis not present

## 2018-03-13 DIAGNOSIS — F341 Dysthymic disorder: Secondary | ICD-10-CM | POA: Diagnosis not present

## 2018-03-27 DIAGNOSIS — R300 Dysuria: Secondary | ICD-10-CM | POA: Diagnosis not present

## 2018-04-21 DIAGNOSIS — R918 Other nonspecific abnormal finding of lung field: Secondary | ICD-10-CM | POA: Diagnosis not present

## 2018-04-21 DIAGNOSIS — I10 Essential (primary) hypertension: Secondary | ICD-10-CM | POA: Diagnosis not present

## 2018-04-21 DIAGNOSIS — R410 Disorientation, unspecified: Secondary | ICD-10-CM | POA: Diagnosis not present

## 2018-04-21 DIAGNOSIS — N390 Urinary tract infection, site not specified: Secondary | ICD-10-CM | POA: Diagnosis not present

## 2018-04-21 DIAGNOSIS — F0390 Unspecified dementia without behavioral disturbance: Secondary | ICD-10-CM | POA: Diagnosis not present

## 2018-04-21 DIAGNOSIS — R531 Weakness: Secondary | ICD-10-CM | POA: Diagnosis not present

## 2018-04-21 DIAGNOSIS — J189 Pneumonia, unspecified organism: Secondary | ICD-10-CM | POA: Diagnosis not present

## 2018-05-01 DIAGNOSIS — R251 Tremor, unspecified: Secondary | ICD-10-CM | POA: Diagnosis not present

## 2018-05-01 DIAGNOSIS — I10 Essential (primary) hypertension: Secondary | ICD-10-CM | POA: Diagnosis not present

## 2018-05-01 DIAGNOSIS — R636 Underweight: Secondary | ICD-10-CM | POA: Diagnosis not present

## 2018-05-01 DIAGNOSIS — J189 Pneumonia, unspecified organism: Secondary | ICD-10-CM | POA: Diagnosis not present

## 2018-05-01 DIAGNOSIS — F0390 Unspecified dementia without behavioral disturbance: Secondary | ICD-10-CM | POA: Diagnosis not present

## 2018-06-12 DEATH — deceased

## 2019-11-01 IMAGING — CT CT CHEST W/ CM
2 of 3 series · 14 of 36 positions shown, 17 images · IV contrast (Isovue)
Comparison: Chest x-ray 01/01/2018.  CT chest 10/14/2007

CLINICAL DATA: Persistent cough.

EXAM:
CT CHEST WITH CONTRAST
TECHNIQUE: Multidetector CT imaging of the chest was performed during
intravenous contrast administration.
CONTRAST:  75mL [id number]-NII IOPAMIDOL ([id number]-NII) INJECTION 61%

[Series 2: axial st · axial · 0.56mm/px · z∈[+1247,+1531]mm · 11 of 168 slices shown, 14 images]
[im 13/168  mediastinal]
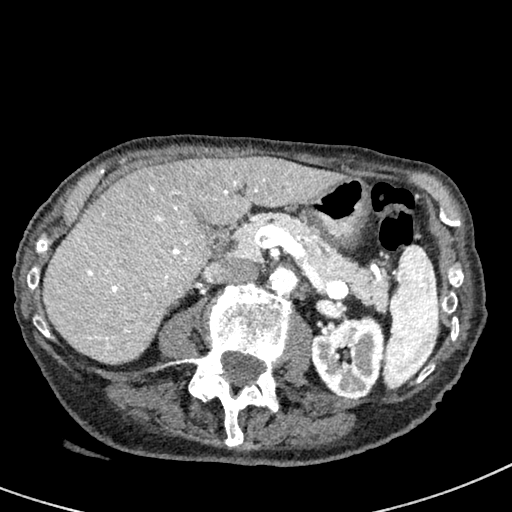
[im 13/168  lung]
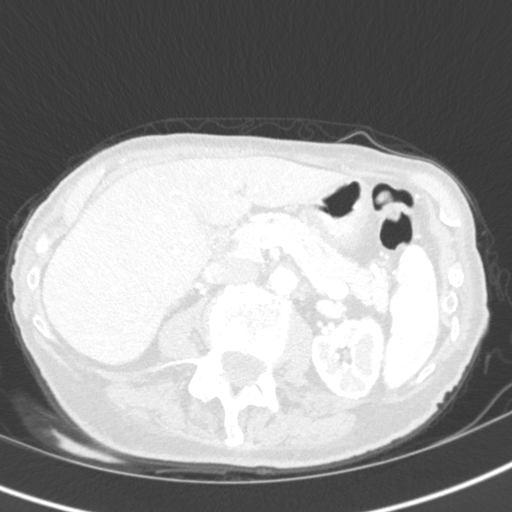
[im 25/168  lung]
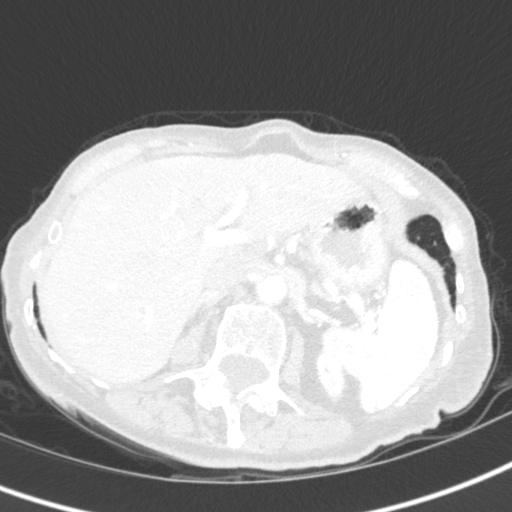
[im 38/168  lung]
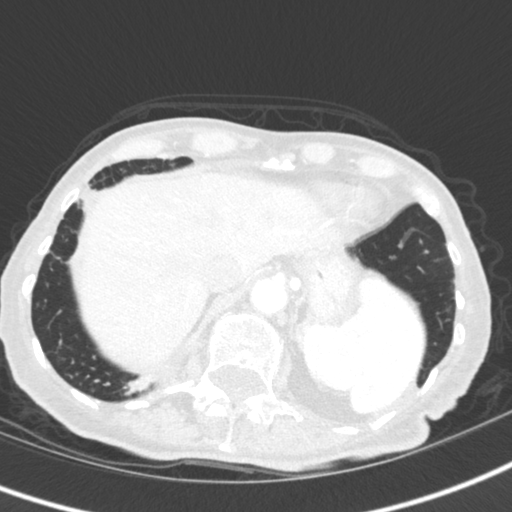
[im 56/168  lung]
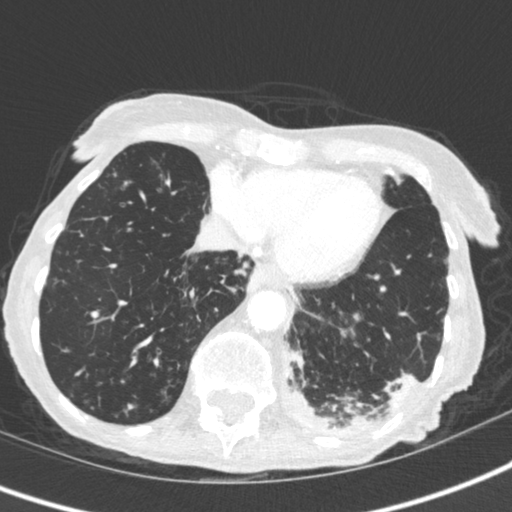
[im 69/168  mediastinal]
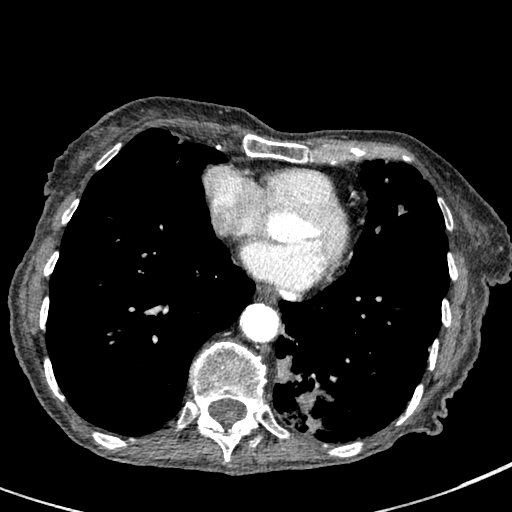
[im 69/168  lung]
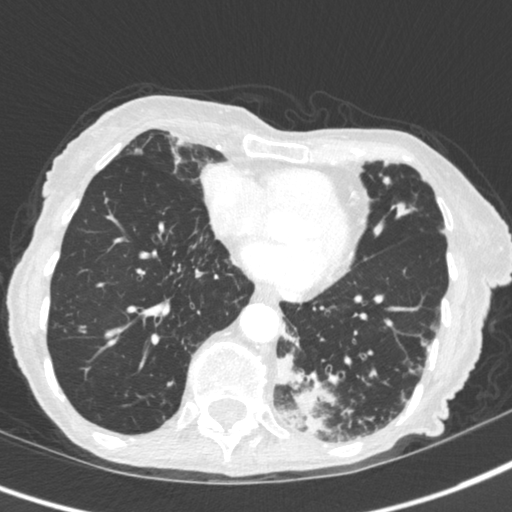
[im 87/168  lung]
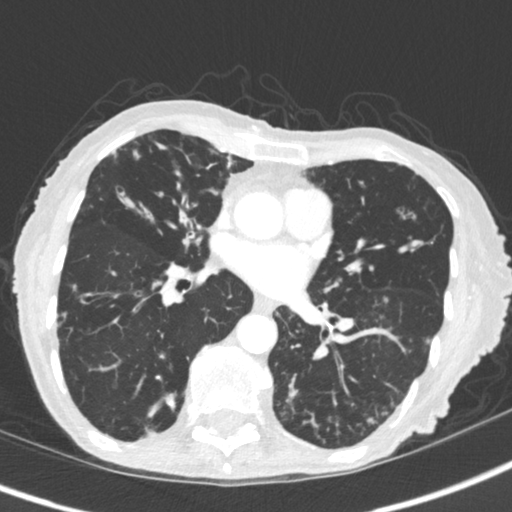
[im 99/168  lung]
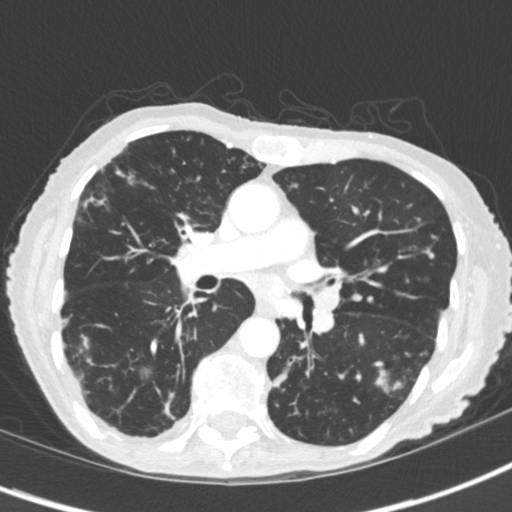
[im 112/168  lung]
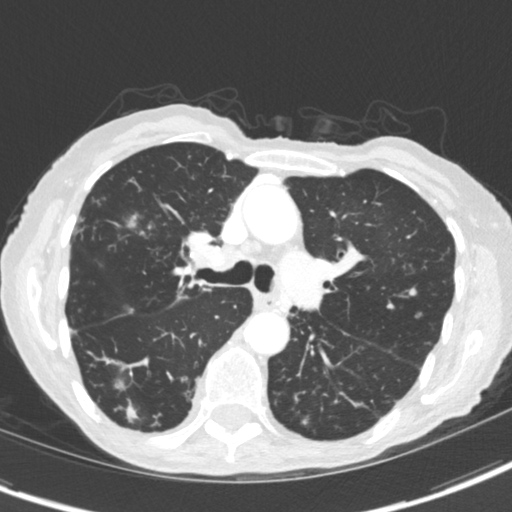
[im 130/168  mediastinal]
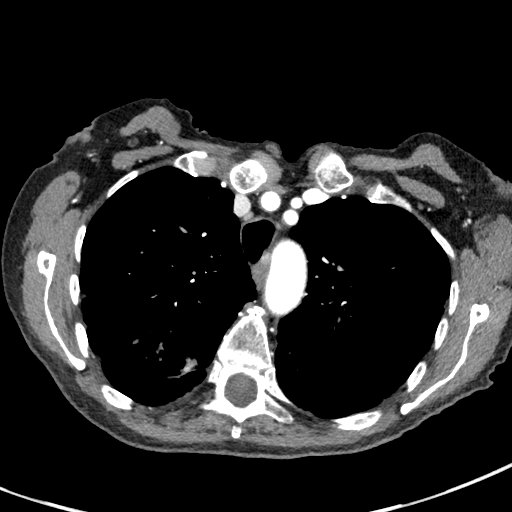
[im 130/168  lung]
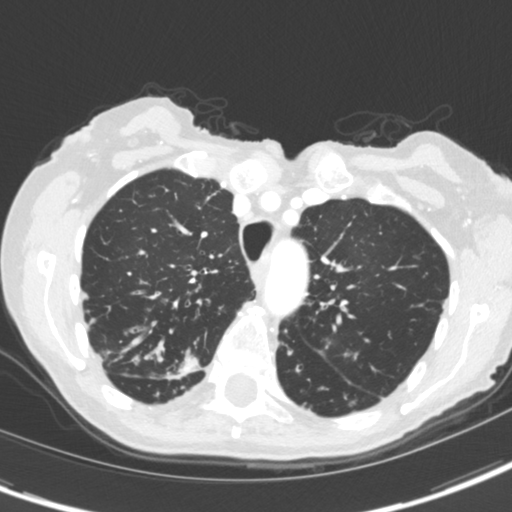
[im 143/168  lung]
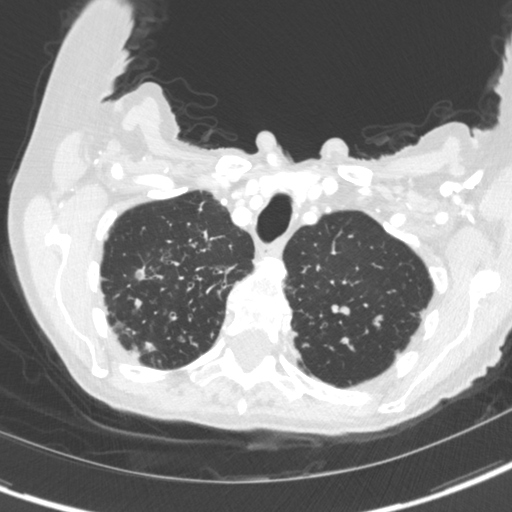
[im 155/168  lung]
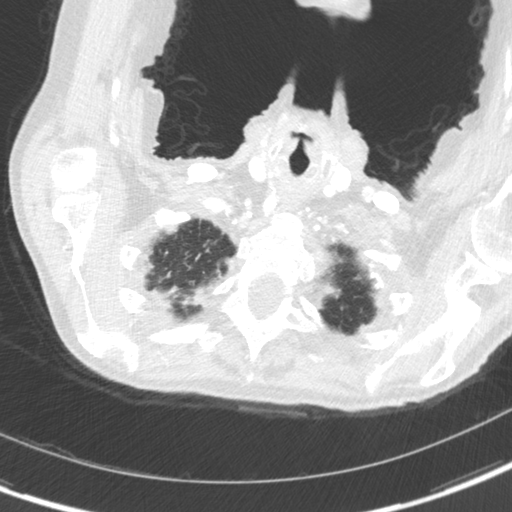

[Series 5: coronal · coronal · 0.55mm/px · 3 of 108 slices shown]
[im 22/108  lung]
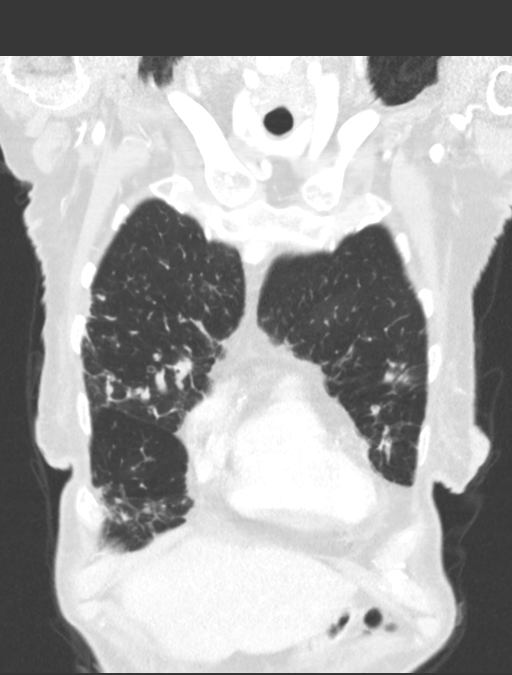
[im 43/108  lung]
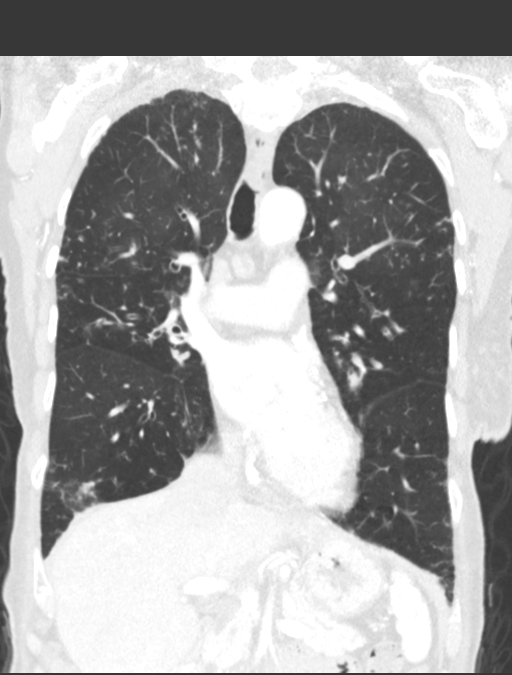
[im 65/108  lung]
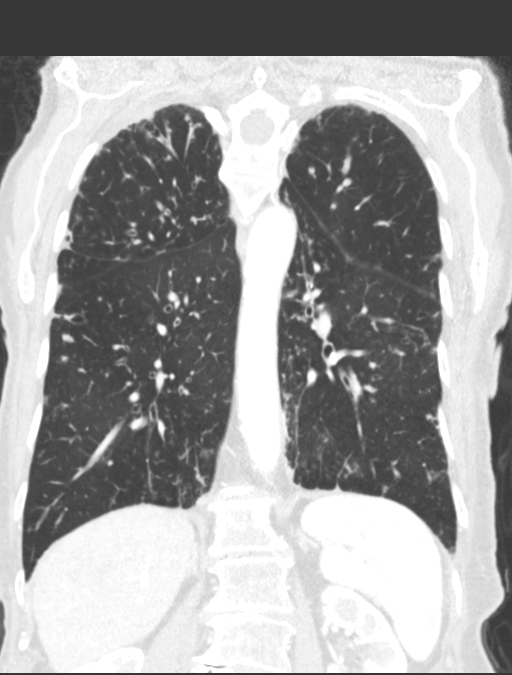

[14 of 36 positions shown; findings below may reference images not displayed]

FINDINGS: Cardiovascular: Heart size normal. Coronary artery calcification is
evident. Atherosclerotic calcification is noted in the wall of the
thoracic aorta.

Mediastinum/Nodes: 16 mm short axis subcarinal lymph node associated
with 11 mm short axis right hilar lymph node. 13 mm short axis left
hilar lymph node. The esophagus has normal imaging features. There
is no axillary lymphadenopathy.

Lungs/Pleura: Diffuse bronchial wall thickening is identified with
numerous areas of bronchiectasis, airway impaction and relatively
widespread tree in bud opacity in all lobes of both lungs.
Bronchiectasis is most advanced in the right middle lobe and
lingula. Areas of more confluent nodular opacity are noted
bilaterally including a 17 mm right lower lobe nodule on image 63
and a 9 mm right middle lobe nodule on image 57. confluent airspace
consolidation is seen in the posterior left lower lobe with a
component of mild associated volume loss.

Upper Abdomen: Laminar flow of unopacified blood identified in
superior mesenteric vein.

Musculoskeletal: Bone windows reveal no worrisome lytic or sclerotic
osseous lesions.
IMPRESSION: 1. Widespread circumferential bronchial wall thickening,
bronchiectasis, and areas of airway impaction. This is associated
with diffuse tree-in-bud nodularity involving all lobes of both
lungs. Features are probably related to advanced atypical infection
(including HUSH).
2. Some areas of nodularity have become more confluent including a
17 mm sub solid nodule in the right lower lobe. Close attention and
follow-up will be required as neoplasm could have this appearance.
Consider follow-up CT chest in 3 months after therapy to reassess.
3. Confluent volume loss and airspace consolidation in the posterior
left lower lobe. Pneumonia is a concern.
4. Mild mediastinal and borderline hilar lymphadenopathy, presumably
reactive. Attention on follow-up recommended.

## 2019-11-01 IMAGING — CR DG CHEST 1V PORT
1 series · 1 of 1 positions shown · non-contrast
Comparison: Chest CT 10/14/2007.  No previous chest radiographs.

CLINICAL DATA: Cough.  Former smoker with history of asthma.

EXAM:
PORTABLE CHEST 1 VIEW

[ap portable]
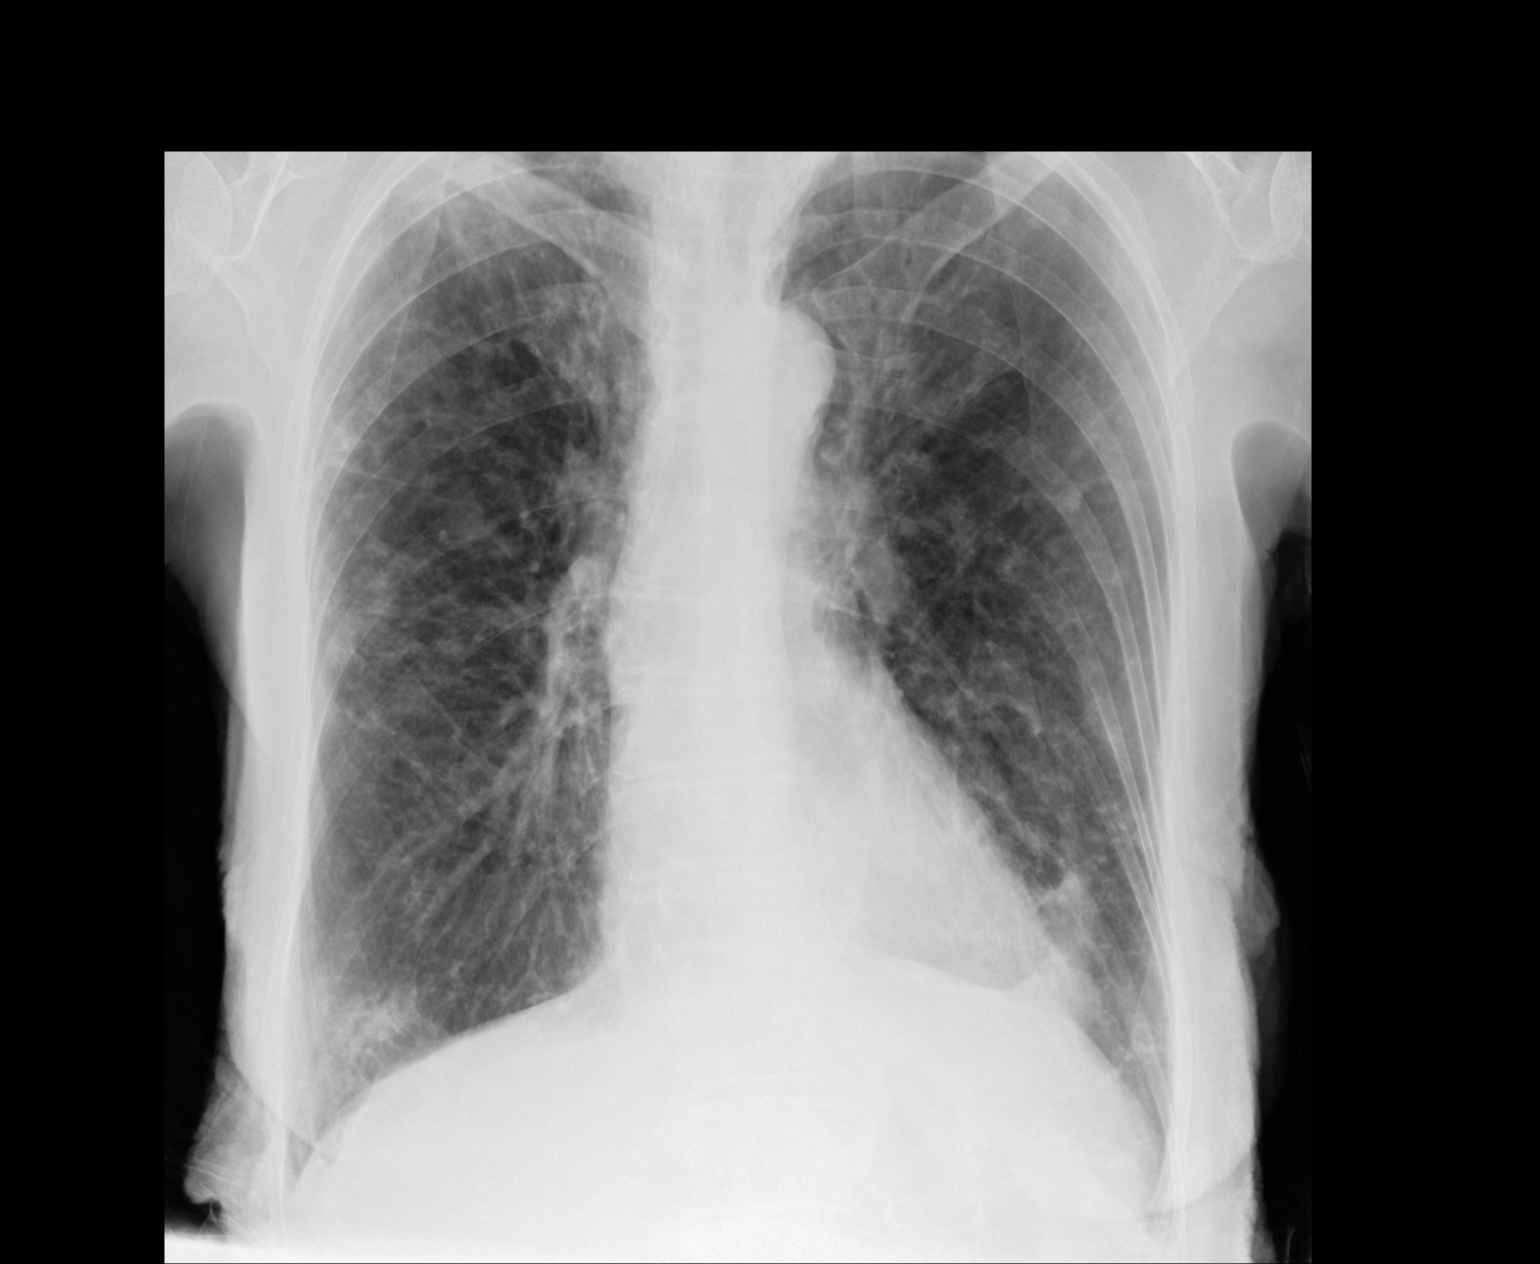

[1 of 1 positions shown; findings below may reference images not displayed]

FINDINGS: Single AP view 5588 hr. The heart size and mediastinal contours are
normal. There is mild aortic atherosclerosis. Patient has developed
progressive reticulonodular densities in both lungs with an upper
lobe predominance. There is asymmetric right apical density which
has progressed. In addition, there are patchy densities at both lung
bases. No significant pleural effusion or pneumothorax.
IMPRESSION: Progressive diffuse reticulonodular densities with asymmetric right
apical and bibasilar pulmonary opacities. Findings suggest chronic
inflammation, possibly atypical mycobacterial infection.
Superimposed acute pneumonia and underlying malignancy cannot be
excluded by this study.

Recommend chest CT for further evaluation. If there is clinical
suspicion of acute infection, that might be delayed until
appropriate antibiotic therapy has been completed.
